# Patient Record
Sex: Male | Born: 1950 | Race: White | Hispanic: No | Marital: Married | Smoking: Never smoker
Health system: Southern US, Community
[De-identification: ages and names within clinical notes are randomized; demographics above are authoritative.]

## PROBLEM LIST (undated history)

## (undated) DIAGNOSIS — I10 Essential (primary) hypertension: Secondary | ICD-10-CM

## (undated) DIAGNOSIS — R112 Nausea with vomiting, unspecified: Secondary | ICD-10-CM

## (undated) DIAGNOSIS — Z9889 Other specified postprocedural states: Secondary | ICD-10-CM

## (undated) DIAGNOSIS — C801 Malignant (primary) neoplasm, unspecified: Secondary | ICD-10-CM

## (undated) DIAGNOSIS — T148XXA Other injury of unspecified body region, initial encounter: Secondary | ICD-10-CM

## (undated) HISTORY — DX: Essential (primary) hypertension: I10

## (undated) HISTORY — PX: KNEE CARTILAGE SURGERY: SHX688

---

## 1999-11-29 ENCOUNTER — Encounter (INDEPENDENT_AMBULATORY_CARE_PROVIDER_SITE_OTHER): Payer: Self-pay

## 1999-11-29 ENCOUNTER — Ambulatory Visit (HOSPITAL_COMMUNITY): Admission: RE | Admit: 1999-11-29 | Discharge: 1999-11-29 | Payer: Self-pay | Admitting: Gastroenterology

## 2002-01-07 ENCOUNTER — Ambulatory Visit (HOSPITAL_COMMUNITY): Admission: RE | Admit: 2002-01-07 | Discharge: 2002-01-07 | Payer: Self-pay | Admitting: Pulmonary Disease

## 2002-02-20 HISTORY — PX: CARDIOVASCULAR STRESS TEST: SHX262

## 2003-02-20 HISTORY — PX: OTHER SURGICAL HISTORY: SHX169

## 2004-06-01 ENCOUNTER — Encounter: Admission: RE | Admit: 2004-06-01 | Discharge: 2004-06-01 | Payer: Self-pay | Admitting: Sports Medicine

## 2004-06-11 ENCOUNTER — Encounter: Admission: RE | Admit: 2004-06-11 | Discharge: 2004-06-11 | Payer: Self-pay | Admitting: Sports Medicine

## 2004-06-22 ENCOUNTER — Ambulatory Visit: Payer: Self-pay | Admitting: Sports Medicine

## 2004-08-03 ENCOUNTER — Ambulatory Visit: Payer: Self-pay | Admitting: Sports Medicine

## 2005-02-15 ENCOUNTER — Ambulatory Visit: Payer: Self-pay | Admitting: Sports Medicine

## 2005-04-19 ENCOUNTER — Ambulatory Visit: Payer: Self-pay | Admitting: Sports Medicine

## 2005-08-31 ENCOUNTER — Encounter: Admission: RE | Admit: 2005-08-31 | Discharge: 2005-08-31 | Payer: Self-pay | Admitting: Sports Medicine

## 2005-08-31 ENCOUNTER — Ambulatory Visit: Payer: Self-pay | Admitting: Sports Medicine

## 2005-09-07 ENCOUNTER — Ambulatory Visit: Payer: Self-pay | Admitting: Sports Medicine

## 2007-05-30 ENCOUNTER — Encounter: Admission: RE | Admit: 2007-05-30 | Discharge: 2007-05-30 | Payer: Self-pay | Admitting: Family Medicine

## 2007-10-11 DIAGNOSIS — T148XXA Other injury of unspecified body region, initial encounter: Secondary | ICD-10-CM

## 2007-10-11 HISTORY — DX: Other injury of unspecified body region, initial encounter: T14.8XXA

## 2009-06-24 ENCOUNTER — Ambulatory Visit: Payer: Self-pay | Admitting: Sports Medicine

## 2009-06-24 DIAGNOSIS — M25569 Pain in unspecified knee: Secondary | ICD-10-CM | POA: Insufficient documentation

## 2009-06-24 DIAGNOSIS — M21619 Bunion of unspecified foot: Secondary | ICD-10-CM | POA: Insufficient documentation

## 2012-03-20 HISTORY — PX: TRANSTHORACIC ECHOCARDIOGRAM: SHX275

## 2012-12-19 ENCOUNTER — Other Ambulatory Visit: Payer: Self-pay | Admitting: Otolaryngology

## 2012-12-19 DIAGNOSIS — J329 Chronic sinusitis, unspecified: Secondary | ICD-10-CM

## 2012-12-21 ENCOUNTER — Ambulatory Visit
Admission: RE | Admit: 2012-12-21 | Discharge: 2012-12-21 | Disposition: A | Discharge status: Home / Self Care | Payer: 59 | Source: Ambulatory Visit | Attending: Otolaryngology | Admitting: Otolaryngology

## 2012-12-21 DIAGNOSIS — J329 Chronic sinusitis, unspecified: Secondary | ICD-10-CM

## 2013-05-07 ENCOUNTER — Encounter (HOSPITAL_COMMUNITY)
Admission: RE | Admit: 2013-05-07 | Discharge: 2013-05-07 | Disposition: A | Discharge status: Home / Self Care | Payer: 59 | Source: Ambulatory Visit | Attending: Ophthalmology | Admitting: Ophthalmology

## 2013-05-07 ENCOUNTER — Encounter (HOSPITAL_COMMUNITY): Payer: Self-pay | Admitting: Pharmacy Technician

## 2013-05-07 ENCOUNTER — Encounter (HOSPITAL_COMMUNITY): Payer: Self-pay

## 2013-05-07 DIAGNOSIS — Z01810 Encounter for preprocedural cardiovascular examination: Secondary | ICD-10-CM | POA: Insufficient documentation

## 2013-05-07 DIAGNOSIS — Z01812 Encounter for preprocedural laboratory examination: Secondary | ICD-10-CM | POA: Insufficient documentation

## 2013-05-07 HISTORY — DX: Other injury of unspecified body region, initial encounter: T14.8XXA

## 2013-05-07 LAB — CBC
HCT: 40.8 % (ref 39.0–52.0)
MCH: 30.9 pg (ref 26.0–34.0)
MCHC: 35 g/dL (ref 30.0–36.0)
MCV: 88.1 fL (ref 78.0–100.0)
Platelets: 255 10*3/uL (ref 150–400)
RBC: 4.63 MIL/uL (ref 4.22–5.81)
RDW: 12.5 % (ref 11.5–15.5)

## 2013-05-07 LAB — BASIC METABOLIC PANEL
BUN: 8 mg/dL (ref 6–23)
Chloride: 102 mEq/L (ref 96–112)
Creatinine, Ser: 1.13 mg/dL (ref 0.50–1.35)
GFR calc Af Amer: 79 mL/min — ABNORMAL LOW (ref >90)
GFR calc non Af Amer: 68 mL/min — ABNORMAL LOW (ref >90)
Potassium: 3.8 mEq/L (ref 3.5–5.1)

## 2013-05-07 NOTE — Patient Instructions (Signed)
Kenneth Booker  05/07/2013   Your procedure is scheduled on:  05/16/2013  Report to [facility] at 10:30 AM.  Call this number if you have problems the morning of surgery: [phone]   Remember:   Do not eat food or drink liquids after midnight.   Take these medicines the morning of surgery with A SIP OF WATER: Irbesartan-HCTZ   Do not wear jewelry, make-up or nail polish.  Do not wear lotions, powders, or perfumes. You may wear deodorant.  Do not shave 48 hours prior to surgery. Men may shave face and neck.  Do not bring valuables to the hospital.  [facility] is not responsible                   for any belongings or valuables.  Contacts, dentures or bridgework may not be worn into surgery.  Leave suitcase in the car. After surgery it may be brought to your room.  For patients admitted to the hospital, checkout time is 11:00 AM the day of  discharge.   Patients discharged the day of surgery will not be allowed to drive  home.  Name and phone number of your driver: [name]  Special Instructions: N/A   Please read over the following fact sheets that you were given: Care and Recovery After Surgery

## 2013-05-15 MED ORDER — NEOMYCIN-POLYMYXIN-DEXAMETH 3.5-10000-0.1 OP OINT
TOPICAL_OINTMENT | OPHTHALMIC | Status: AC
  Filled 2013-05-15: qty 3.5

## 2013-05-15 MED ORDER — LIDOCAINE HCL 3.5 % OP GEL
OPHTHALMIC | Status: AC
  Filled 2013-05-15: qty 5

## 2013-05-15 MED ORDER — LIDOCAINE HCL (PF) 1 % IJ SOLN
INTRAMUSCULAR | Status: AC
  Filled 2013-05-15: qty 2

## 2013-05-15 MED ORDER — CYCLOPENTOLATE-PHENYLEPHRINE 0.2-1 % OP SOLN
OPHTHALMIC | Status: AC
  Filled 2013-05-15: qty 2

## 2013-05-15 MED ORDER — PHENYLEPHRINE HCL 2.5 % OP SOLN
OPHTHALMIC | Status: AC
  Filled 2013-05-15: qty 2

## 2013-05-15 MED ORDER — TETRACAINE HCL 0.5 % OP SOLN
OPHTHALMIC | Status: AC
  Filled 2013-05-15: qty 2

## 2013-05-16 ENCOUNTER — Encounter (HOSPITAL_COMMUNITY)
Admission: RE | Disposition: A | Discharge status: Home / Self Care | Payer: Self-pay | Source: Ambulatory Visit | Attending: Ophthalmology

## 2013-05-16 ENCOUNTER — Ambulatory Visit (HOSPITAL_COMMUNITY): Payer: 59 | Admitting: Anesthesiology

## 2013-05-16 ENCOUNTER — Encounter (HOSPITAL_COMMUNITY): Payer: Self-pay | Admitting: *Deleted

## 2013-05-16 ENCOUNTER — Encounter (HOSPITAL_COMMUNITY): Payer: Self-pay | Admitting: Anesthesiology

## 2013-05-16 ENCOUNTER — Ambulatory Visit (HOSPITAL_COMMUNITY)
Admission: RE | Admit: 2013-05-16 | Discharge: 2013-05-16 | Disposition: A | Discharge status: Home / Self Care | Payer: 59 | Source: Ambulatory Visit | Attending: Ophthalmology | Admitting: Ophthalmology

## 2013-05-16 DIAGNOSIS — H2589 Other age-related cataract: Secondary | ICD-10-CM | POA: Insufficient documentation

## 2013-05-16 DIAGNOSIS — I10 Essential (primary) hypertension: Secondary | ICD-10-CM | POA: Insufficient documentation

## 2013-05-16 HISTORY — DX: Nausea with vomiting, unspecified: R11.2

## 2013-05-16 HISTORY — DX: Nausea with vomiting, unspecified: Z98.890

## 2013-05-16 HISTORY — PX: CATARACT EXTRACTION W/PHACO: SHX586

## 2013-05-16 SURGERY — PHACOEMULSIFICATION, CATARACT, WITH IOL INSERTION
Anesthesia: Monitor Anesthesia Care | Site: Eye | Laterality: Right | Wound class: Clean

## 2013-05-16 MED ORDER — NEOMYCIN-POLYMYXIN-DEXAMETH 0.1 % OP OINT
TOPICAL_OINTMENT | OPHTHALMIC | Status: DC | PRN
  Administered 2013-05-16: 1 via OPHTHALMIC

## 2013-05-16 MED ORDER — LIDOCAINE HCL (PF) 1 % IJ SOLN
INTRAMUSCULAR | Status: DC | PRN
  Administered 2013-05-16: .6 mL

## 2013-05-16 MED ORDER — MIDAZOLAM HCL 2 MG/2ML IJ SOLN
INTRAMUSCULAR | Status: AC
  Filled 2013-05-16: qty 2

## 2013-05-16 MED ORDER — FENTANYL CITRATE 0.05 MG/ML IJ SOLN
INTRAMUSCULAR | Status: DC | PRN
  Administered 2013-05-16 (×2): 25 ug via INTRAVENOUS

## 2013-05-16 MED ORDER — BSS IO SOLN
INTRAOCULAR | Status: DC | PRN
  Administered 2013-05-16: 15 mL via INTRAOCULAR

## 2013-05-16 MED ORDER — PHENYLEPHRINE HCL 2.5 % OP SOLN
1.0000 [drp] | OPHTHALMIC | Status: AC
  Administered 2013-05-16 (×3): 1 [drp] via OPHTHALMIC

## 2013-05-16 MED ORDER — MIDAZOLAM HCL 2 MG/2ML IJ SOLN
1.0000 mg | INTRAMUSCULAR | Status: DC | PRN
  Administered 2013-05-16: 2 mg via INTRAVENOUS

## 2013-05-16 MED ORDER — MIDAZOLAM HCL 5 MG/5ML IJ SOLN
INTRAMUSCULAR | Status: DC | PRN
  Administered 2013-05-16: 2 mg via INTRAVENOUS

## 2013-05-16 MED ORDER — EPINEPHRINE HCL 1 MG/ML IJ SOLN
INTRAOCULAR | Status: DC | PRN
  Administered 2013-05-16: 12:00:00

## 2013-05-16 MED ORDER — TETRACAINE HCL 0.5 % OP SOLN
1.0000 [drp] | OPHTHALMIC | Status: AC
  Administered 2013-05-16 (×3): 1 [drp] via OPHTHALMIC

## 2013-05-16 MED ORDER — LACTATED RINGERS IV SOLN
INTRAVENOUS | Status: DC
  Administered 2013-05-16: 12:00:00 via INTRAVENOUS

## 2013-05-16 MED ORDER — CYCLOPENTOLATE-PHENYLEPHRINE 0.2-1 % OP SOLN
1.0000 [drp] | OPHTHALMIC | Status: AC
  Administered 2013-05-16 (×3): 1 [drp] via OPHTHALMIC

## 2013-05-16 MED ORDER — EPINEPHRINE HCL 1 MG/ML IJ SOLN
INTRAMUSCULAR | Status: AC
  Filled 2013-05-16: qty 1

## 2013-05-16 MED ORDER — ONDANSETRON HCL 4 MG/2ML IJ SOLN: 4.0000 mg | Freq: Once | INTRAMUSCULAR | Status: DC | PRN

## 2013-05-16 MED ORDER — FENTANYL CITRATE 0.05 MG/ML IJ SOLN
INTRAMUSCULAR | Status: AC
  Filled 2013-05-16: qty 2

## 2013-05-16 MED ORDER — FENTANYL CITRATE 0.05 MG/ML IJ SOLN
25.0000 ug | INTRAMUSCULAR | Status: AC
  Administered 2013-05-16 (×2): 25 ug via INTRAVENOUS

## 2013-05-16 MED ORDER — FENTANYL CITRATE 0.05 MG/ML IJ SOLN: 25.0000 ug | INTRAMUSCULAR | Status: DC | PRN

## 2013-05-16 MED ORDER — LIDOCAINE HCL 3.5 % OP GEL
1.0000 "application " | Freq: Once | OPHTHALMIC | Status: AC
  Administered 2013-05-16: 1 via OPHTHALMIC

## 2013-05-16 MED ORDER — POVIDONE-IODINE 5 % OP SOLN
OPHTHALMIC | Status: DC | PRN
  Administered 2013-05-16: 1 via OPHTHALMIC

## 2013-05-16 MED ORDER — PROVISC 10 MG/ML IO SOLN
INTRAOCULAR | Status: DC | PRN
  Administered 2013-05-16: 8.5 mg via INTRAOCULAR

## 2013-05-16 SURGICAL SUPPLY — 32 items
CAPSULAR TENSION RING-AMO (OPHTHALMIC RELATED) IMPLANT
CLOTH BEACON ORANGE TIMEOUT ST (SAFETY) ×2 IMPLANT
EYE SHIELD UNIVERSAL CLEAR (GAUZE/BANDAGES/DRESSINGS) ×2 IMPLANT
GLOVE BIO SURGEON STRL SZ 6.5 (GLOVE) IMPLANT
GLOVE BIOGEL PI IND STRL 6.5 (GLOVE) IMPLANT
GLOVE BIOGEL PI IND STRL 7.0 (GLOVE) IMPLANT
GLOVE BIOGEL PI IND STRL 7.5 (GLOVE) IMPLANT
GLOVE BIOGEL PI INDICATOR 6.5 (GLOVE)
GLOVE BIOGEL PI INDICATOR 7.0 (GLOVE) ×1
GLOVE BIOGEL PI INDICATOR 7.5 (GLOVE)
GLOVE ECLIPSE 6.5 STRL STRAW (GLOVE) IMPLANT
GLOVE ECLIPSE 7.0 STRL STRAW (GLOVE) IMPLANT
GLOVE ECLIPSE 7.5 STRL STRAW (GLOVE) IMPLANT
GLOVE EXAM NITRILE LRG STRL (GLOVE) IMPLANT
GLOVE EXAM NITRILE MD LF STRL (GLOVE) ×1 IMPLANT
GLOVE SKINSENSE NS SZ6.5 (GLOVE)
GLOVE SKINSENSE NS SZ7.0 (GLOVE)
GLOVE SKINSENSE STRL SZ6.5 (GLOVE) IMPLANT
GLOVE SKINSENSE STRL SZ7.0 (GLOVE) IMPLANT
KIT VITRECTOMY (OPHTHALMIC RELATED) IMPLANT
PAD ARMBOARD 7.5X6 YLW CONV (MISCELLANEOUS) ×1 IMPLANT
PROC W NO LENS (INTRAOCULAR LENS)
PROC W SPEC LENS (INTRAOCULAR LENS)
PROCESS W NO LENS (INTRAOCULAR LENS) IMPLANT
PROCESS W SPEC LENS (INTRAOCULAR LENS) IMPLANT
RING MALYGIN (MISCELLANEOUS) IMPLANT
SIGHTPATH CAT PROC W REG LENS (Ophthalmic Related) ×2 IMPLANT
SYR TB 1ML LL NO SAFETY (SYRINGE) ×2 IMPLANT
TAPE SURG TRANSPORE 1 IN (GAUZE/BANDAGES/DRESSINGS) ×1 IMPLANT
TAPE SURGICAL TRANSPORE 1 IN (GAUZE/BANDAGES/DRESSINGS) ×1
VISCOELASTIC ADDITIONAL (OPHTHALMIC RELATED) IMPLANT
WATER STERILE IRR 250ML POUR (IV SOLUTION) ×1 IMPLANT

## 2013-05-16 NOTE — H&P (Signed)
I have reviewed the H&P, the patient was re-examined, and I have identified no interval changes in medical condition and plan of care since the history and physical of record  

## 2013-05-16 NOTE — Transfer of Care (Signed)
Immediate Anesthesia Transfer of Care Note  Patient: Kenneth Booker  Procedure(s) Performed: Procedure(s) with comments: CATARACT EXTRACTION PHACO AND INTRAOCULAR LENS PLACEMENT (IOC) (Right) - CDE:5.25  Patient Location: Short Stay  Anesthesia Type:MAC  Level of Consciousness: awake, alert , oriented and patient cooperative  Airway & Oxygen Therapy: Patient Spontanous Breathing  Post-op Assessment: Report given to PACU RN, Post -op Vital signs reviewed and stable and Patient moving all extremities  Post vital signs: Reviewed and stable  Complications: No apparent anesthesia complications

## 2013-05-16 NOTE — Anesthesia Preprocedure Evaluation (Signed)
Anesthesia Evaluation  Patient identified by MRN, date of birth, ID band Patient awake    Reviewed: Allergy & Precautions, H&P , NPO status , Patient's Chart, lab work & pertinent test results  Airway Mallampati: II TM Distance: >3 FB Neck ROM: Full    Dental  (+) Edentulous Upper and Edentulous Lower   Pulmonary neg pulmonary ROS,  breath sounds clear to auscultation        Cardiovascular hypertension, Pt. on medications Rhythm:Regular Rate:Normal     Neuro/Psych    GI/Hepatic negative GI ROS,   Endo/Other    Renal/GU      Musculoskeletal   Abdominal   Peds  Hematology   Anesthesia Other Findings   Reproductive/Obstetrics                           Anesthesia Physical Anesthesia Plan  ASA: II  Anesthesia Plan: MAC   Post-op Pain Management:    Induction: Intravenous  Airway Management Planned: Nasal Cannula  Additional Equipment:   Intra-op Plan:   Post-operative Plan:   Informed Consent: I have reviewed the patients History and Physical, chart, labs and discussed the procedure including the risks, benefits and alternatives for the proposed anesthesia with the patient or authorized representative who has indicated his/her understanding and acceptance.     Plan Discussed with:   Anesthesia Plan Comments:         Anesthesia Quick Evaluation

## 2013-05-16 NOTE — Anesthesia Postprocedure Evaluation (Signed)
  Anesthesia Post-op Note  Patient: Kenneth Booker  Procedure(s) Performed: Procedure(s) with comments: CATARACT EXTRACTION PHACO AND INTRAOCULAR LENS PLACEMENT (IOC) (Right) - CDE:5.25  Patient Location: Short Stay  Anesthesia Type:MAC  Level of Consciousness: awake, alert , oriented and patient cooperative  Airway and Oxygen Therapy: Patient Spontanous Breathing  Post-op Pain: none  Post-op Assessment: Post-op Vital signs reviewed, Patient's Cardiovascular Status Stable, Respiratory Function Stable, Patent Airway and Pain level controlled  Post-op Vital Signs: Reviewed and stable  Complications: No apparent anesthesia complications

## 2013-05-16 NOTE — Op Note (Signed)
Date of Admission: 05/16/2013  Date of Surgery: 05/16/2013  Pre-Op Dx: Cataract  Right  Eye  Post-Op Dx: Cataract  Right  Eye,  Dx Code 366.19  Surgeon: Gemma Payor, M.D.  Assistants: None  Anesthesia: Topical with MAC  Indications: Painless, progressive loss of vision with compromise of daily activities.  Surgery: Cataract Extraction with Intraocular lens Implant Right Eye  Discription: The patient had dilating drops and viscous lidocaine placed into the left eye in the pre-op holding area. After transfer to the operating room, a time out was performed. The patient was then prepped and draped. Beginning with a 75 degree blade a paracentesis port was made at the surgeon's 2 o'clock position. The anterior chamber was then filled with 1% non-preserved lidocaine. This was followed by filling the anterior chamber with Provisc. A bent cystatome needle was used to create a continuous tear capsulotomy. Hydrodissection was performed with balanced salt solution on a Fine canula. The lens nucleus was then removed using the phacoemulsification handpiece. Residual cortex was removed with the I&A handpiece. The anterior chamber and capsular bag were refilled with Provisc. A posterior chamber intraocular lens was placed into the capsular bag with it's injector. The implant was positioned with the Kuglan hook. The Provisc was then removed from the anterior chamber and capsular bag with the I&A handpiece. Stromal hydration of the main incision and paracentesis port was performed with BSS on a Fine canula. The wounds were tested for leak which was negative. The patient tolerated the procedure well. There were no operative complications. The patient was then transferred to the recovery room in stable condition.  Complications: None  Specimen: None  EBL: None  Prosthetic device: B&L enVista, MX60, power 23.5D, SN [id number].

## 2013-05-17 ENCOUNTER — Encounter (HOSPITAL_COMMUNITY): Payer: Self-pay | Admitting: Ophthalmology

## 2013-05-28 ENCOUNTER — Encounter (HOSPITAL_COMMUNITY): Payer: Self-pay | Admitting: Pharmacy Technician

## 2013-05-30 ENCOUNTER — Encounter (HOSPITAL_COMMUNITY): Payer: Self-pay

## 2013-05-30 ENCOUNTER — Encounter (HOSPITAL_COMMUNITY)
Admission: RE | Admit: 2013-05-30 | Discharge: 2013-05-30 | Disposition: A | Discharge status: Home / Self Care | Payer: 59 | Source: Ambulatory Visit | Attending: Family Medicine | Admitting: Family Medicine

## 2013-05-31 MED ORDER — CYCLOPENTOLATE-PHENYLEPHRINE 0.2-1 % OP SOLN
OPHTHALMIC | Status: AC
  Filled 2013-05-31: qty 2

## 2013-05-31 MED ORDER — TETRACAINE HCL 0.5 % OP SOLN
OPHTHALMIC | Status: AC
  Filled 2013-05-31: qty 2

## 2013-05-31 MED ORDER — LIDOCAINE HCL (PF) 1 % IJ SOLN
INTRAMUSCULAR | Status: AC
  Filled 2013-05-31: qty 2

## 2013-05-31 MED ORDER — LIDOCAINE HCL 3.5 % OP GEL
OPHTHALMIC | Status: AC
  Filled 2013-05-31: qty 5

## 2013-05-31 MED ORDER — NEOMYCIN-POLYMYXIN-DEXAMETH 3.5-10000-0.1 OP OINT
TOPICAL_OINTMENT | OPHTHALMIC | Status: AC
  Filled 2013-05-31: qty 3.5

## 2013-06-03 ENCOUNTER — Encounter (HOSPITAL_COMMUNITY)
Admission: RE | Disposition: A | Discharge status: Home / Self Care | Payer: Self-pay | Source: Ambulatory Visit | Attending: Ophthalmology

## 2013-06-03 ENCOUNTER — Ambulatory Visit (HOSPITAL_COMMUNITY)
Admission: RE | Admit: 2013-06-03 | Discharge: 2013-06-03 | Disposition: A | Discharge status: Home / Self Care | Payer: 59 | Source: Ambulatory Visit | Attending: Ophthalmology | Admitting: Ophthalmology

## 2013-06-03 ENCOUNTER — Encounter (HOSPITAL_COMMUNITY): Payer: Self-pay | Admitting: Anesthesiology

## 2013-06-03 ENCOUNTER — Ambulatory Visit (HOSPITAL_COMMUNITY): Payer: 59 | Admitting: Anesthesiology

## 2013-06-03 ENCOUNTER — Encounter (HOSPITAL_COMMUNITY): Payer: Self-pay | Admitting: *Deleted

## 2013-06-03 DIAGNOSIS — I10 Essential (primary) hypertension: Secondary | ICD-10-CM | POA: Insufficient documentation

## 2013-06-03 DIAGNOSIS — H2589 Other age-related cataract: Secondary | ICD-10-CM | POA: Insufficient documentation

## 2013-06-03 HISTORY — PX: CATARACT EXTRACTION W/PHACO: SHX586

## 2013-06-03 SURGERY — PHACOEMULSIFICATION, CATARACT, WITH IOL INSERTION
Anesthesia: Monitor Anesthesia Care | Site: Eye | Laterality: Left | Wound class: Clean

## 2013-06-03 MED ORDER — FENTANYL CITRATE 0.05 MG/ML IJ SOLN: 25.0000 ug | INTRAMUSCULAR | Status: DC

## 2013-06-03 MED ORDER — ONDANSETRON HCL 4 MG/2ML IJ SOLN
4.0000 mg | Freq: Once | INTRAMUSCULAR | Status: AC
  Administered 2013-06-03: 4 mg via INTRAVENOUS

## 2013-06-03 MED ORDER — PHENYLEPHRINE HCL 2.5 % OP SOLN
1.0000 [drp] | OPHTHALMIC | Status: AC
  Administered 2013-06-03 (×3): 1 [drp] via OPHTHALMIC

## 2013-06-03 MED ORDER — CYCLOPENTOLATE-PHENYLEPHRINE 0.2-1 % OP SOLN
1.0000 [drp] | OPHTHALMIC | Status: AC
  Administered 2013-06-03 (×3): 1 [drp] via OPHTHALMIC

## 2013-06-03 MED ORDER — EPINEPHRINE HCL 1 MG/ML IJ SOLN
INTRAOCULAR | Status: DC | PRN
  Administered 2013-06-03: 12:00:00

## 2013-06-03 MED ORDER — PHENYLEPHRINE HCL 2.5 % OP SOLN
OPHTHALMIC | Status: AC
  Filled 2013-06-03: qty 2

## 2013-06-03 MED ORDER — LIDOCAINE HCL 3.5 % OP GEL: 1.0000 "application " | Freq: Once | OPHTHALMIC | Status: DC

## 2013-06-03 MED ORDER — NEOMYCIN-POLYMYXIN-DEXAMETH 0.1 % OP OINT
TOPICAL_OINTMENT | OPHTHALMIC | Status: DC | PRN
  Administered 2013-06-03: 1

## 2013-06-03 MED ORDER — MIDAZOLAM HCL 2 MG/2ML IJ SOLN
INTRAMUSCULAR | Status: AC
  Filled 2013-06-03: qty 2

## 2013-06-03 MED ORDER — TETRACAINE HCL 0.5 % OP SOLN
1.0000 [drp] | OPHTHALMIC | Status: AC
  Administered 2013-06-03 (×3): 1 [drp] via OPHTHALMIC

## 2013-06-03 MED ORDER — LACTATED RINGERS IV SOLN
INTRAVENOUS | Status: DC
  Administered 2013-06-03: 1000 mL via INTRAVENOUS

## 2013-06-03 MED ORDER — ONDANSETRON HCL 4 MG/2ML IJ SOLN
INTRAMUSCULAR | Status: AC
Start: 2013-06-03 — End: 2013-06-03
  Filled 2013-06-03: qty 2

## 2013-06-03 MED ORDER — POVIDONE-IODINE 5 % OP SOLN
OPHTHALMIC | Status: DC | PRN
  Administered 2013-06-03: 1 via OPHTHALMIC

## 2013-06-03 MED ORDER — MIDAZOLAM HCL 2 MG/2ML IJ SOLN
1.0000 mg | INTRAMUSCULAR | Status: DC | PRN
  Administered 2013-06-03: 2 mg via INTRAVENOUS

## 2013-06-03 MED ORDER — BSS IO SOLN
INTRAOCULAR | Status: DC | PRN
  Administered 2013-06-03: 15 mL via INTRAOCULAR

## 2013-06-03 MED ORDER — PROVISC 10 MG/ML IO SOLN
INTRAOCULAR | Status: DC | PRN
  Administered 2013-06-03: 8.5 mg via INTRAOCULAR

## 2013-06-03 MED ORDER — LIDOCAINE HCL (PF) 1 % IJ SOLN
INTRAMUSCULAR | Status: DC | PRN
  Administered 2013-06-03: .4 mL

## 2013-06-03 SURGICAL SUPPLY — 32 items
CAPSULAR TENSION RING-AMO (OPHTHALMIC RELATED) IMPLANT
CLOTH BEACON ORANGE TIMEOUT ST (SAFETY) ×1 IMPLANT
EYE SHIELD UNIVERSAL CLEAR (GAUZE/BANDAGES/DRESSINGS) ×1 IMPLANT
GLOVE BIO SURGEON STRL SZ 6.5 (GLOVE) IMPLANT
GLOVE BIOGEL PI IND STRL 6.5 (GLOVE) ×1 IMPLANT
GLOVE BIOGEL PI IND STRL 7.0 (GLOVE) ×1 IMPLANT
GLOVE BIOGEL PI IND STRL 7.5 (GLOVE) IMPLANT
GLOVE BIOGEL PI INDICATOR 6.5 (GLOVE) ×1
GLOVE BIOGEL PI INDICATOR 7.0 (GLOVE) ×1
GLOVE BIOGEL PI INDICATOR 7.5 (GLOVE)
GLOVE ECLIPSE 6.5 STRL STRAW (GLOVE) IMPLANT
GLOVE ECLIPSE 7.0 STRL STRAW (GLOVE) IMPLANT
GLOVE ECLIPSE 7.5 STRL STRAW (GLOVE) IMPLANT
GLOVE EXAM NITRILE LRG STRL (GLOVE) IMPLANT
GLOVE EXAM NITRILE MD LF STRL (GLOVE) IMPLANT
GLOVE SKINSENSE NS SZ6.5 (GLOVE)
GLOVE SKINSENSE NS SZ7.0 (GLOVE)
GLOVE SKINSENSE STRL SZ6.5 (GLOVE) IMPLANT
GLOVE SKINSENSE STRL SZ7.0 (GLOVE) IMPLANT
KIT VITRECTOMY (OPHTHALMIC RELATED) IMPLANT
PAD ARMBOARD 7.5X6 YLW CONV (MISCELLANEOUS) ×1 IMPLANT
PROC W NO LENS (INTRAOCULAR LENS)
PROC W SPEC LENS (INTRAOCULAR LENS)
PROCESS W NO LENS (INTRAOCULAR LENS) IMPLANT
PROCESS W SPEC LENS (INTRAOCULAR LENS) IMPLANT
RING MALYGIN (MISCELLANEOUS) IMPLANT
SIGHTPATH CAT PROC W REG LENS (Ophthalmic Related) ×2 IMPLANT
SYR TB 1ML LL NO SAFETY (SYRINGE) ×1 IMPLANT
TAPE SURG TRANSPORE 1 IN (GAUZE/BANDAGES/DRESSINGS) IMPLANT
TAPE SURGICAL TRANSPORE 1 IN (GAUZE/BANDAGES/DRESSINGS) ×1
VISCOELASTIC ADDITIONAL (OPHTHALMIC RELATED) IMPLANT
WATER STERILE IRR 250ML POUR (IV SOLUTION) ×1 IMPLANT

## 2013-06-03 NOTE — Anesthesia Postprocedure Evaluation (Signed)
  Anesthesia Post-op Note  Patient: Kenneth Booker  Procedure(s) Performed: Procedure(s) with comments: CATARACT EXTRACTION PHACO AND INTRAOCULAR LENS PLACEMENT (IOC) (Left) - CDE: 6.26  Patient Location: Short Stay  Anesthesia Type:MAC  Level of Consciousness: awake, alert , oriented and patient cooperative  Airway and Oxygen Therapy: Patient Spontanous Breathing  Post-op Pain: none  Post-op Assessment: Post-op Vital signs reviewed, Patient's Cardiovascular Status Stable, Respiratory Function Stable, Patent Airway, No signs of Nausea or vomiting and Pain level controlled  Post-op Vital Signs: Reviewed and stable  Complications: No apparent anesthesia complications

## 2013-06-03 NOTE — Anesthesia Preprocedure Evaluation (Signed)
Anesthesia Evaluation  Patient identified by MRN, date of birth, ID band Patient awake    Reviewed: Allergy & Precautions, H&P , NPO status , Patient's Chart, lab work & pertinent test results  History of Anesthesia Complications (+) PONV  Airway Mallampati: II TM Distance: >3 FB Neck ROM: Full    Dental  (+) Edentulous Upper and Edentulous Lower   Pulmonary neg pulmonary ROS,  breath sounds clear to auscultation        Cardiovascular hypertension, Pt. on medications Rhythm:Regular Rate:Normal     Neuro/Psych    GI/Hepatic negative GI ROS,   Endo/Other    Renal/GU      Musculoskeletal   Abdominal   Peds  Hematology   Anesthesia Other Findings   Reproductive/Obstetrics                           Anesthesia Physical Anesthesia Plan  ASA: II  Anesthesia Plan: MAC   Post-op Pain Management:    Induction: Intravenous  Airway Management Planned: Nasal Cannula  Additional Equipment:   Intra-op Plan:   Post-operative Plan:   Informed Consent: I have reviewed the patients History and Physical, chart, labs and discussed the procedure including the risks, benefits and alternatives for the proposed anesthesia with the patient or authorized representative who has indicated his/her understanding and acceptance.     Plan Discussed with:   Anesthesia Plan Comments:         Anesthesia Quick Evaluation

## 2013-06-03 NOTE — Op Note (Signed)
Date of Admission: 06/03/2013  Date of Surgery: 06/03/2013  Pre-Op Dx: Cataract  Left  Eye  Post-Op Dx: Cataract  Left  Eye,  Dx Code 366.19  Surgeon: Gemma Payor, M.D.  Assistants: None  Anesthesia: Topical with MAC  Indications: Painless, progressive loss of vision with compromise of daily activities.  Surgery: Cataract Extraction with Intraocular lens Implant Left Eye  Discription: The patient had dilating drops and viscous lidocaine placed into the left eye in the pre-op holding area. After transfer to the operating room, a time out was performed. The patient was then prepped and draped. Beginning with a 75 degree blade a paracentesis port was made at the surgeon's 2 o'clock position. The anterior chamber was then filled with 1% non-preserved lidocaine. This was followed by filling the anterior chamber with Provisc. A bent cystatome needle was used to create a continuous tear capsulotomy. Hydrodissection was performed with balanced salt solution on a Fine canula. The lens nucleus was then removed using the phacoemulsification handpiece. Residual cortex was removed with the I&A handpiece. The anterior chamber and capsular bag were refilled with Provisc. A posterior chamber intraocular lens was placed into the capsular bag with it's injector. The implant was positioned with the Kuglan hook. The Provisc was then removed from the anterior chamber and capsular bag with the I&A handpiece. Stromal hydration of the main incision and paracentesis port was performed with BSS on a Fine canula. The wounds were tested for leak which was negative. The patient tolerated the procedure well. There were no operative complications. The patient was then transferred to the recovery room in stable condition.  Complications: None  Specimen: None  EBL: None  Prosthetic device: B&L enVista, MX60, power 22.5D, SN [id number].

## 2013-06-03 NOTE — Transfer of Care (Signed)
Immediate Anesthesia Transfer of Care Note  Patient: Kenneth Booker  Procedure(s) Performed: Procedure(s) with comments: CATARACT EXTRACTION PHACO AND INTRAOCULAR LENS PLACEMENT (IOC) (Left) - CDE: 6.26  Patient Location: Short Stay  Anesthesia Type:MAC  Level of Consciousness: awake, alert , oriented and patient cooperative  Airway & Oxygen Therapy: Patient Spontanous Breathing  Post-op Assessment: Report given to PACU RN, Post -op Vital signs reviewed and stable and Patient moving all extremities  Post vital signs: Reviewed and stable  Complications: No apparent anesthesia complications

## 2013-06-03 NOTE — H&P (Signed)
I have reviewed the H&P, the patient was re-examined, and I have identified no interval changes in medical condition and plan of care since the history and physical of record  

## 2013-06-03 NOTE — Preoperative (Signed)
Beta Blockers   Reason not to administer Beta Blockers:Not Applicable 

## 2013-06-05 ENCOUNTER — Encounter (HOSPITAL_COMMUNITY): Payer: Self-pay | Admitting: Ophthalmology

## 2013-06-11 ENCOUNTER — Emergency Department (HOSPITAL_COMMUNITY)
Admission: EM | Admit: 2013-06-11 | Discharge: 2013-06-11 | Disposition: A | Discharge status: Home / Self Care | Payer: 59 | Attending: Emergency Medicine | Admitting: Emergency Medicine

## 2013-06-11 ENCOUNTER — Encounter (HOSPITAL_COMMUNITY): Payer: Self-pay | Admitting: Emergency Medicine

## 2013-06-11 DIAGNOSIS — I10 Essential (primary) hypertension: Secondary | ICD-10-CM | POA: Insufficient documentation

## 2013-06-11 DIAGNOSIS — Z7982 Long term (current) use of aspirin: Secondary | ICD-10-CM | POA: Insufficient documentation

## 2013-06-11 DIAGNOSIS — R5381 Other malaise: Secondary | ICD-10-CM | POA: Insufficient documentation

## 2013-06-11 DIAGNOSIS — R002 Palpitations: Secondary | ICD-10-CM | POA: Insufficient documentation

## 2013-06-11 DIAGNOSIS — Z79899 Other long term (current) drug therapy: Secondary | ICD-10-CM | POA: Insufficient documentation

## 2013-06-11 DIAGNOSIS — Z87828 Personal history of other (healed) physical injury and trauma: Secondary | ICD-10-CM | POA: Insufficient documentation

## 2013-06-11 LAB — CBC WITH DIFFERENTIAL/PLATELET
Basophils Absolute: 0 10*3/uL (ref 0.0–0.1)
Basophils Relative: 1 % (ref 0–1)
Lymphocytes Relative: 29 % (ref 12–46)
MCHC: 35.9 g/dL (ref 30.0–36.0)
Monocytes Absolute: 0.5 10*3/uL (ref 0.1–1.0)
Neutro Abs: 2.6 10*3/uL (ref 1.7–7.7)
Neutrophils Relative %: 56 % (ref 43–77)
Platelets: 263 10*3/uL (ref 150–400)
RDW: 12.6 % (ref 11.5–15.5)
WBC: 4.7 10*3/uL (ref 4.0–10.5)

## 2013-06-11 LAB — COMPREHENSIVE METABOLIC PANEL
ALT: 13 U/L (ref 0–53)
AST: 18 U/L (ref 0–37)
Albumin: 4.2 g/dL (ref 3.5–5.2)
Chloride: 108 mEq/L (ref 96–112)
Creatinine, Ser: 0.96 mg/dL (ref 0.50–1.35)
Potassium: 4.2 mEq/L (ref 3.5–5.1)
Sodium: 145 mEq/L (ref 135–145)
Total Bilirubin: 0.4 mg/dL (ref 0.3–1.2)

## 2013-06-11 LAB — POCT I-STAT TROPONIN I

## 2013-06-11 NOTE — ED Provider Notes (Signed)
CSN: [id number]     Arrival date & time 06/11/13  1824 History   First MD Initiated Contact with Patient 06/11/13 2038     Chief Complaint  Patient presents with  . Palpitations   (Consider location/radiation/quality/duration/timing/severity/associated sxs/prior Treatment) Patient is a 62 y.o. male presenting with palpitations. The history is provided by the patient. No language interpreter was used.  Palpitations Palpitations quality:  Irregular Onset quality:  Gradual Duration:  7 days Timing:  Intermittent Progression:  Waxing and waning Chronicity:  New Context: not anxiety, not caffeine, not illicit drugs, not nicotine and not stimulant use   Relieved by:  None tried Worsened by:  Nothing tried Ineffective treatments:  None tried Associated symptoms: weakness   Associated symptoms: no shortness of breath and no syncope     Past Medical History  Diagnosis Date  . Hypertension   . Cartilage tear 2009    right knee  . PONV (postoperative nausea and vomiting)    Past Surgical History  Procedure Laterality Date  . Knee cartilage surgery Right 2007?  Marland Kitchen Cataract extraction w/phaco Right 05/16/2013    Procedure: CATARACT EXTRACTION PHACO AND INTRAOCULAR LENS PLACEMENT (IOC);  Surgeon: Gemma Payor, MD;  Location: [facility];  Service: Ophthalmology;  Laterality: Right;  CDE:5.25  . Cataract extraction w/phaco Left 06/03/2013    Procedure: CATARACT EXTRACTION PHACO AND INTRAOCULAR LENS PLACEMENT (IOC);  Surgeon: Gemma Payor, MD;  Location: [facility];  Service: Ophthalmology;  Laterality: Left;  CDE: 6.26   No family history on file. History  Substance Use Topics  . Smoking status: Never Smoker   . Smokeless tobacco: Not on file  . Alcohol Use: No    Review of Systems  Respiratory: Negative for shortness of breath.   Cardiovascular: Positive for palpitations. Negative for syncope.  All other systems reviewed and are negative.    Allergies  Norvasc  Home Medications   Current  Outpatient Rx  Name  Route  Sig  Dispense  Refill  . aspirin 81 MG tablet   Oral   Take 81 mg by mouth daily.         Marland Kitchen Besifloxacin HCl (BESIVANCE) 0.6 % SUSP   Left Eye   Place 1 drop into the left eye 3 (three) times daily.         . Bromfenac Sodium (PROLENSA) 0.07 % SOLN   Left Eye   Place 1 drop into the left eye daily.         . Difluprednate (DUREZOL) 0.05 % EMUL   Left Eye   Place 1 drop into the left eye 3 (three) times daily.         . irbesartan-hydrochlorothiazide (AVALIDE) 150-12.5 MG per tablet   Oral   Take 1 tablet by mouth daily.          BP 135/83  Pulse 50  Temp(Src) 98.1 F (36.7 C) (Oral)  Resp 18  SpO2 95% Physical Exam  Nursing note and vitals reviewed. Constitutional: He is oriented to person, place, and time. He appears well-developed and well-nourished.  HENT:  Head: Normocephalic.  Eyes: Pupils are equal, round, and reactive to light.  Neck: Normal range of motion.  Cardiovascular: Normal rate and regular rhythm.   Pulmonary/Chest: Effort normal and breath sounds normal.  Abdominal: Soft. Bowel sounds are normal.  Musculoskeletal: Normal range of motion. He exhibits no edema and no tenderness.  Lymphadenopathy:    He has no cervical adenopathy.  Neurological: He is alert and oriented to  person, place, and time.  Skin: Skin is warm and dry.  Psychiatric: He has a normal mood and affect. His behavior is normal. Judgment and thought content normal.    ED Course  Procedures (including critical care time) Labs Review Labs Reviewed  COMPREHENSIVE METABOLIC PANEL - Abnormal; Notable for the following:    GFR calc non Af Amer 88 (*)    All other components within normal limits  CBC WITH DIFFERENTIAL  POCT I-STAT TROPONIN I   Imaging Review No results found.  Date: 06/11/2013  Rate: 67   Rhythm: normal sinus rhythm  QRS Axis: normal  Intervals: normal  ST/T Wave abnormalities: normal  Conduction Disutrbances:none   Narrative Interpretation: NSR  Old EKG Reviewed: unchanged Palpitations seem to be related with recent completion of cataract surgery.  Patient states intermittent palpitations over the last week, worsening today.  Check of current medications does not point to palpitations as a side effect.  Continuous monitoring in ED does not reveal any ectopic beats.  Labs reviewed, WNL. Results shared with patient.  Patient to follow-up with PCP. MDM  Palpitations.    Jimmye Norman, NP 06/12/13 [phone]

## 2013-06-11 NOTE — ED Notes (Signed)
Pt. reports palpitations " heart skipping " for several days , denies chest pain , no SOB or nausea, pt. stated history of cataract surgery 3 weeks ago.

## 2013-06-11 NOTE — ED Notes (Signed)
Patient resting. Denies palpitations. Sinus Bradycardia on monitor.

## 2013-06-14 NOTE — ED Provider Notes (Signed)
Medical screening examination/treatment/procedure(s) were performed by non-physician practitioner and as supervising physician I was immediately available for consultation/collaboration.  Chrisanne Loose T Mary-Anne Polizzi, MD 06/14/13 0755 

## 2013-06-18 ENCOUNTER — Ambulatory Visit (INDEPENDENT_AMBULATORY_CARE_PROVIDER_SITE_OTHER): Payer: 59 | Admitting: Cardiovascular Disease

## 2013-06-18 ENCOUNTER — Encounter: Payer: Self-pay | Admitting: Cardiovascular Disease

## 2013-06-18 VITALS — BP 118/86 | HR 68 | Resp 16 | Ht 66.0 in | Wt 176.8 lb

## 2013-06-18 DIAGNOSIS — R002 Palpitations: Secondary | ICD-10-CM

## 2013-06-18 DIAGNOSIS — I059 Rheumatic mitral valve disease, unspecified: Secondary | ICD-10-CM

## 2013-06-18 DIAGNOSIS — I10 Essential (primary) hypertension: Secondary | ICD-10-CM

## 2013-06-18 DIAGNOSIS — I341 Nonrheumatic mitral (valve) prolapse: Secondary | ICD-10-CM

## 2013-06-18 NOTE — Patient Instructions (Addendum)
We will call with the results of your 14 day Event Monitor.  Your physician recommends that you schedule a follow-up appointment in: 3 months.

## 2013-06-19 DIAGNOSIS — R002 Palpitations: Secondary | ICD-10-CM | POA: Insufficient documentation

## 2013-06-26 DIAGNOSIS — I341 Nonrheumatic mitral (valve) prolapse: Secondary | ICD-10-CM | POA: Insufficient documentation

## 2013-06-26 DIAGNOSIS — I10 Essential (primary) hypertension: Secondary | ICD-10-CM | POA: Insufficient documentation

## 2013-06-26 NOTE — Assessment & Plan Note (Signed)
His most recent echocardiogram showed only borderline criteria for mitral valve prolapse and there was only trivial mitral insufficiency. His physical exam does not in any way suggested this has worsened. I doubt that it has anything to do with his current complaint

## 2013-06-26 NOTE — Assessment & Plan Note (Signed)
Blood pressure is well controlled. Hydrochlorothiazide related hypokalemia was considered, but his lab tests were normal at his ER visit.

## 2013-06-26 NOTE — Assessment & Plan Note (Signed)
It sounds most likely that Kenneth Booker is having isolated PACs and or PVCs. It is hard for me to find a connection between this and his eyedrops. One of the eyedrops as a quinolone antibiotic and theoretically could cause QT interval prolongation and arrhythmia, but I think this is a bit of a stretch. Have recommended that he wear an event monitor and we will base further evaluation and treatment on the findings of this.

## 2013-06-26 NOTE — Progress Notes (Signed)
Patient ID: Kenneth Booker, male   DOB: November 30, 1950, 62 y.o.   MRN: [id number]     Reason for office visit Palpitations  Kenneth Booker is a fit 62 year old with a history of hypertension and borderline criteria for mitral valve prolapse who has recently developed palpitations lasting for about a week. He associates this with recent cataract surgery and thinks it might be related to one of the eyedrops that he is using. These eyedrops seemed to be a steroid, and nonsteroidal anti-inflammatory and a quinolone antibiotic.  He does not have shortness of breath or chest pain and denies full blown syncope but the palpitations were so frequent and troublesome that he actually went to the emergency room. While there he did not have any ectopy on the monitor. Laboratory tests were normal. His electrocardiogram was normal. He continued to have palpitations after leaving the emergency room and has decided to suspend all his eyedrops.  Following his cataract surgery he has not been working out at a gym but up to that point he had been going lifting weights at least once a week. He did not have any palpitations during exercise    Allergies  Allergen Reactions  . Norvasc [Amlodipine Besylate] Anxiety    Current Outpatient Prescriptions  Medication Sig Dispense Refill  . aspirin 81 MG tablet Take 81 mg by mouth daily.      . irbesartan-hydrochlorothiazide (AVALIDE) 150-12.5 MG per tablet Take 1 tablet by mouth daily.      Marland Kitchen Besifloxacin HCl (BESIVANCE) 0.6 % SUSP Place 1 drop into the left eye 3 (three) times daily.      . Bromfenac Sodium (PROLENSA) 0.07 % SOLN Place 1 drop into the left eye daily.      . Difluprednate (DUREZOL) 0.05 % EMUL Place 1 drop into the left eye 3 (three) times daily.       No current facility-administered medications for this visit.    Past Medical History  Diagnosis Date  . Hypertension   . Cartilage tear 2009    right knee  . PONV (postoperative nausea and vomiting)      Past Surgical History  Procedure Laterality Date  . Knee cartilage surgery Right 2007?  Marland Kitchen Cataract extraction w/phaco Right 05/16/2013    Procedure: CATARACT EXTRACTION PHACO AND INTRAOCULAR LENS PLACEMENT (IOC);  Surgeon: Gemma Payor, MD;  Location: [facility];  Service: Ophthalmology;  Laterality: Right;  CDE:5.25  . Cataract extraction w/phaco Left 06/03/2013    Procedure: CATARACT EXTRACTION PHACO AND INTRAOCULAR LENS PLACEMENT (IOC);  Surgeon: Gemma Payor, MD;  Location: [facility];  Service: Ophthalmology;  Laterality: Left;  CDE: 6.26    No family history on file.  History   Social History  . Marital Status: Married    Spouse Name: N/A    Number of Children: N/A  . Years of Education: N/A   Occupational History  . Not on file.   Social History Main Topics  . Smoking status: Never Smoker   . Smokeless tobacco: Not on file  . Alcohol Use: No  . Drug Use: No  . Sexual Activity: Not on file   Other Topics Concern  . Not on file   Social History Narrative  . No narrative on file    Review of systems: The patient specifically denies any chest pain at rest or with exertion, dyspnea at rest or with exertion, orthopnea, paroxysmal nocturnal dyspnea, syncope, palpitations, focal neurological deficits, intermittent claudication, lower extremity edema, unexplained weight gain, cough, hemoptysis or wheezing.  The patient also denies abdominal pain, nausea, vomiting, dysphagia, diarrhea, constipation, polyuria, polydipsia, dysuria, hematuria, frequency, urgency, abnormal bleeding or bruising, fever, chills, unexpected weight changes, mood swings, change in skin or hair texture, change in voice quality, auditory or visual problems, allergic reactions or rashes, new musculoskeletal complaints other than usual "aches and pains".   PHYSICAL EXAM BP 118/86  Pulse 68  Resp 16  Ht 5\' 6"  (1.676 m)  Wt 176 lb 12.8 oz (80.196 kg)  BMI 28.55 kg/m2  General: Alert, oriented x3, no  distress Head: no evidence of trauma, PERRL, EOMI, no exophtalmos or lid lag, no myxedema, no xanthelasma; normal ears, nose and oropharynx Neck: normal jugular venous pulsations and no hepatojugular reflux; brisk carotid pulses without delay and no carotid bruits Chest: clear to auscultation, no signs of consolidation by percussion or palpation, normal fremitus, symmetrical and full respiratory excursions Cardiovascular: normal position and quality of the apical impulse, regular rhythm, normal first and second heart sounds, no murmurs, rubs or gallops Abdomen: no tenderness or distention, no masses by palpation, no abnormal pulsatility or arterial bruits, normal bowel sounds, no hepatosplenomegaly Extremities: no clubbing, cyanosis or edema; 2+ radial, ulnar and brachial pulses bilaterally; 2+ right femoral, posterior tibial and dorsalis pedis pulses; 2+ left femoral, posterior tibial and dorsalis pedis pulses; no subclavian or femoral bruits Neurological: grossly nonfocal   EKG: NSR  Lipid Panel  No results found for this basename: chol, trig, hdl, cholhdl, vldl, ldlcalc    BMET    Component Value Date/Time   NA 145 06/11/2013 1845   K 4.2 06/11/2013 1845   CL 108 06/11/2013 1845   CO2 28 06/11/2013 1845   GLUCOSE 94 06/11/2013 1845   BUN 13 06/11/2013 1845   CREATININE 0.96 06/11/2013 1845   CALCIUM 9.4 06/11/2013 1845   GFRNONAA 88* 06/11/2013 1845   GFRAA >90 06/11/2013 1845     ASSESSMENT AND PLAN Heart palpitations It sounds most likely that Kenneth Booker is having isolated PACs and or PVCs. It is hard for me to find a connection between this and his eyedrops. One of the eyedrops as a quinolone antibiotic and theoretically could cause QT interval prolongation and arrhythmia, but I think this is a bit of a stretch. Have recommended that he wear an event monitor and we will base further evaluation and treatment on the findings of this.  MVP (mitral valve prolapse) His most recent echocardiogram showed  only borderline criteria for mitral valve prolapse and there was only trivial mitral insufficiency. His physical exam does not in any way suggested this has worsened. I doubt that it has anything to do with his current complaint  HTN (hypertension)  Blood pressure is well controlled. Hydrochlorothiazide related hypokalemia was considered, but his lab tests were normal at his ER visit.  Orders Placed This Encounter  Procedures  . Cardiac event monitor   No orders of the defined types were placed in this encounter.    Junious Silk, MD, [facility] [phone] office [phone] pager

## 2013-07-04 ENCOUNTER — Telehealth: Payer: Self-pay | Admitting: *Deleted

## 2013-07-04 NOTE — Telephone Encounter (Signed)
Event monitor results called to patient.  Occas. PAC's w/atrial couplets/short runs of PAT, nocturnal bradycardia.  Patient states his sx have completely resolved since stopping the eye drops and the medicine has gotten out of his system. Instructed to call if he has any problems.  Voiced understanding.

## 2013-07-18 ENCOUNTER — Encounter: Payer: Self-pay | Admitting: Cardiovascular Disease

## 2013-09-25 ENCOUNTER — Ambulatory Visit: Payer: 59 | Admitting: Cardiovascular Disease

## 2013-11-14 ENCOUNTER — Ambulatory Visit: Payer: 59 | Admitting: Cardiovascular Disease

## 2013-11-21 ENCOUNTER — Ambulatory Visit: Payer: 59 | Admitting: Cardiovascular Disease

## 2013-12-20 ENCOUNTER — Ambulatory Visit (INDEPENDENT_AMBULATORY_CARE_PROVIDER_SITE_OTHER): Payer: 59 | Admitting: Cardiovascular Disease

## 2013-12-20 ENCOUNTER — Encounter: Payer: Self-pay | Admitting: Cardiovascular Disease

## 2013-12-20 VITALS — BP 142/90 | HR 56 | Resp 16 | Ht 66.0 in | Wt 174.1 lb

## 2013-12-20 DIAGNOSIS — I10 Essential (primary) hypertension: Secondary | ICD-10-CM

## 2013-12-20 DIAGNOSIS — R002 Palpitations: Secondary | ICD-10-CM

## 2013-12-20 NOTE — Progress Notes (Signed)
Patient ID: Kenneth Booker, male   DOB: [birth year], 63 y.o.   MRN: [id number]      Reason for office visit Followup palpitations, hypertension, mitral valve prolapse  Kenneth Booker's palpitations resolved after he stopped using steroid eyedrops. He is back to his usual pattern of a rare skipped beats. He walks 4 miles a day in roughly one hour, 4 or 5 days a week. He chops his own firewood. After retiring he has stopped going to the gym since it is too far from his home. He has noticed some worsening complaints of arthritis. He denies any cardiac symptoms. 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 He has some degree of white coat hypertension.  His event monitor showed PACs and occasional atrial couplets that correlated well with his palpitations. There was no complex atrial or ventricular arrhythmia. His baseline rhythm is mild sinus bradycardia.   Allergies  Allergen Reactions  . Tarka [Trandolapril-Verapamil Hcl Er]   . Norvasc [Amlodipine Besylate] Anxiety    Current Outpatient Prescriptions  Medication Sig Dispense Refill  . aspirin 81 MG tablet Take 81 mg by mouth daily.      . irbesartan-hydrochlorothiazide (AVALIDE) 150-12.5 MG per tablet Take 1 tablet by mouth daily.       No current facility-administered medications for this visit.    Past Medical History  Diagnosis Date  . Cartilage tear 2009    right knee  . PONV (postoperative nausea and vomiting)   . Systemic hypertension     Past Surgical History  Procedure Laterality Date  . Knee cartilage surgery Right 2007?  [facility] Cataract extraction w/phaco Right 05/16/2013    Procedure: CATARACT EXTRACTION PHACO AND INTRAOCULAR LENS PLACEMENT (IOC);  Surgeon: Tonny Branch, MD;  Location: [facility];  Service: Ophthalmology;  Laterality: Right;  CDE:5.25  . Cataract extraction w/phaco Left 06/03/2013    Procedure: CATARACT EXTRACTION PHACO AND INTRAOCULAR LENS  PLACEMENT (IOC);  Surgeon: Tonny Branch, MD;  Location: [facility];  Service: Ophthalmology;  Laterality: Left;  CDE: 6.26  . Renal duplex  02/20/2003    Bilateral renal arteries demonstrate normal valveswith no hemodynamically significance. Bilateral kidneysare symmetrical in shape with no obvious abnormality visualized.  . Cardiovascular stress test  02/20/2002    Negative adequate Bruce protocal exercise stress test, there is scintigraphic evidence of apical thinning with normal dynamic images EF 60% calculated by QGS  . Transthoracic echocardiogram  03/20/2012    EF >55%, suggestive of borderline impaired LV relaxation with normal tissue doppler, mild MR, mild TR.     Family History  Problem Relation Age of Onset  . Heart attack Mother   . Cancer Father     Lung cancer  . Hypertension Sister   . Cancer Sister 86    Breast cancer  . Cancer Brother     Colon cancer  . Diabetes Paternal Grandmother   . Hypertension Sister   . Cancer Brother     Stomach cancer    History   Social History  . Marital Status: Married    Spouse Name: N/A    Number of Children: N/A  . Years of Education: N/A   Occupational History  . Not on file.   Social History Main Topics  . Smoking status: Never Smoker   . Smokeless tobacco: Not on file  . Alcohol Use: No  . Drug Use: No  . Sexual Activity: Not on  file   Other Topics Concern  . Not on file   Social History Narrative  . No narrative on file    Review of systems: The patient specifically denies any chest pain at rest or with exertion, dyspnea at rest or with exertion, orthopnea, paroxysmal nocturnal dyspnea, syncope, palpitations, focal neurological deficits, intermittent claudication, lower extremity edema, unexplained weight gain, cough, hemoptysis or wheezing.  The patient also denies abdominal pain, nausea, vomiting, dysphagia, diarrhea, constipation, polyuria, polydipsia, dysuria, hematuria, frequency, urgency, abnormal bleeding or  bruising, fever, chills, unexpected weight changes, mood swings, change in skin or hair texture, change in voice quality, auditory or visual problems, allergic reactions or rashes, new musculoskeletal complaints other than usual "aches and pains".   PHYSICAL EXAM BP 142/90  Pulse 56  Resp 16  Ht 5\' 6"  (1.676 m)  Wt 78.971 kg (174 lb 1.6 oz)  BMI 28.11 kg/m2  General: Alert, oriented x3, no distress Head: no evidence of trauma, PERRL, EOMI, no exophtalmos or lid lag, no myxedema, no xanthelasma; normal ears, nose and oropharynx Neck: normal jugular venous pulsations and no hepatojugular reflux; brisk carotid pulses without delay and no carotid bruits Chest: clear to auscultation, no signs of consolidation by percussion or palpation, normal fremitus, symmetrical and full respiratory excursions Cardiovascular: normal position and quality of the apical impulse, regular rhythm, normal first and second heart sounds, no murmurs, rubs or gallops Abdomen: no tenderness or distention, no masses by palpation, no abnormal pulsatility or arterial bruits, normal bowel sounds, no hepatosplenomegaly Extremities: no clubbing, cyanosis or edema; 2+ radial, ulnar and brachial pulses bilaterally; 2+ right femoral, posterior tibial and dorsalis pedis pulses; 2+ left femoral, posterior tibial and dorsalis pedis pulses; no subclavian or femoral bruits Neurological: grossly nonfocal   EKG: nsr  Lipid Panel  No results found for this basename: chol, trig, hdl, cholhdl, vldl, ldlcalc    BMET    Component Value Date/Time   NA 145 06/11/2013 1845   K 4.2 06/11/2013 1845   CL 108 06/11/2013 1845   CO2 28 06/11/2013 1845   GLUCOSE 94 06/11/2013 1845   BUN 13 06/11/2013 1845   CREATININE 0.96 06/11/2013 1845   CALCIUM 9.4 06/11/2013 1845   GFRNONAA 88* 06/11/2013 1845   GFRAA >90 06/11/2013 1845     ASSESSMENT AND PLAN  I congratulated Kenneth Booker for continued attention to his health and exercising regularly. He may benefit  from restarting some type of upper body exercise in addition to his walking as this may help with his arthritis. His arrhythmia is a 9 and minimally symptomatic and it does not require specific therapy. I believe that his blood pressure is indeed well controlled and that he has some mild "white coat hypertension". There is no evidence that his mitral valve prolapse is a clinically important issue. He will followup on a yearly basis.  Orders Placed This Encounter  Procedures  . EKG 12-Lead   No orders of the defined types were placed in this encounter.    Holli Humbles, MD, [place] [phone] office [phone] pager

## 2013-12-20 NOTE — Patient Instructions (Signed)
Dr.Croitoru wants you to follow-up in: 1 year. You will receive a reminder letter in the mail two months in advance. If you don't receive a letter, please call our office to schedule the follow-up appointment.  

## 2014-05-07 ENCOUNTER — Encounter: Payer: Self-pay | Admitting: Cardiovascular Disease

## 2014-05-07 ENCOUNTER — Telehealth: Payer: Self-pay | Admitting: Cardiovascular Disease

## 2014-05-07 MED ORDER — AVALIDE 150-12.5 MG PO TABS: 1.0000 | ORAL_TABLET | Freq: Every day | ORAL | Status: DC

## 2014-05-07 NOTE — Telephone Encounter (Signed)
Kenneth Booker is calling because his blood pressure medication he seems to have side effects of anxiety Irbesartan 150/12.5 mg .. Please call    Thanks

## 2014-05-07 NOTE — Telephone Encounter (Signed)
This encounter was created in error - please disregard.

## 2014-05-07 NOTE — Telephone Encounter (Signed)
Spoke with pt, he has been on avalide for quite sometime but it was recently changed to a generic. The pt has started having anxiety problems as he did before with norvasc. He did not take the avalide sunday and had no problem with anxiety. He took it Monday and the anxiety came back. He would like to get the brand name only. Will send a script to the pharm and wait for contact information to call his pharm. He was not able to give me that information today. Patient voiced understanding the process to get it approved may take time.

## 2014-05-07 NOTE — Telephone Encounter (Signed)
Spoke to the nurse this morning about his medication and wants to know if we can send in a prescription for his Bp medication Ablaline(not sure of the spelling )  to the his pharmacy [facility] in  [place]..   Thanks

## 2014-08-20 ENCOUNTER — Encounter: Payer: Self-pay | Admitting: Cardiovascular Disease

## 2014-08-25 ENCOUNTER — Encounter: Payer: Self-pay | Admitting: Cardiovascular Disease

## 2014-12-31 ENCOUNTER — Telehealth: Payer: Self-pay | Admitting: Cardiovascular Disease

## 2014-12-31 DIAGNOSIS — Z79899 Other long term (current) drug therapy: Secondary | ICD-10-CM

## 2014-12-31 MED ORDER — IRBESARTAN-HYDROCHLOROTHIAZIDE 300-12.5 MG PO TABS: 1.0000 | ORAL_TABLET | Freq: Every day | ORAL | Status: DC

## 2014-12-31 NOTE — Telephone Encounter (Signed)
Pt reports hadn't checked BP in ~1+ month, checked the other day and noted higher than normal.  Takes avalide daily in AM as only med. Takes BP reading in afternoon.  Pt having no symptoms w/ this - wanted to know if med adjustments were necessary. I updated med list OTP.  I advised would defer to Dr. Sallyanne Kuster for advice. Also added pt for routine 1 yr f/u in May as he was last seen March 2015.   Will f/u w/ patient w/ recommendation.

## 2014-12-31 NOTE — Telephone Encounter (Signed)
Pt advised to keep check on BPs, start new med. Instructed to report to [facility] at end of April for lab draw. He voiced understanding.  Refill submitted to patient's preferred pharmacy.

## 2014-12-31 NOTE — Telephone Encounter (Signed)
Please increase to Avalide 300/12.5, check BMET in a month and see me first available (non-urgent)

## 2014-12-31 NOTE — Telephone Encounter (Signed)
Mr.Gough is calling because he is waiting for the outcome of what was decided by Dr. Sallyanne Kuster

## 2014-12-31 NOTE — Telephone Encounter (Signed)
Pt c/o BP issue: STAT if pt c/o blurred vision, one-sided weakness or slurred speech  1. What are your last 5 BP readings? 3/21 5:30- 137/83, 8pm-133/82, 3/22 7am- 143/95, 7:30am 138/99, 147/96 4PM, 139/98 7PM, 3/23 145/93  2. Are you having any other symptoms (ex. Dizziness, headache, blurred vision, passed out)? NO  3. What is your BP issue? It seems to be higher than normal

## 2015-02-19 ENCOUNTER — Ambulatory Visit (INDEPENDENT_AMBULATORY_CARE_PROVIDER_SITE_OTHER): Payer: 59 | Admitting: Cardiovascular Disease

## 2015-02-19 ENCOUNTER — Encounter: Payer: Self-pay | Admitting: Cardiovascular Disease

## 2015-02-19 VITALS — BP 140/86 | HR 64 | Resp 16 | Ht 66.0 in | Wt 175.3 lb

## 2015-02-19 DIAGNOSIS — Z8249 Family history of ischemic heart disease and other diseases of the circulatory system: Secondary | ICD-10-CM

## 2015-02-19 DIAGNOSIS — I341 Nonrheumatic mitral (valve) prolapse: Secondary | ICD-10-CM

## 2015-02-19 DIAGNOSIS — I10 Essential (primary) hypertension: Secondary | ICD-10-CM

## 2015-02-19 NOTE — Progress Notes (Signed)
Patient ID: Kenneth Booker, male   DOB: December 03, 1950, 64 y.o.   MRN: [id number]     Cardiology Office Note   Date:  02/20/2015   ID:  Kenneth Booker, DOB Feb 02, 1951, MRN [id number]  PCP:  Simona Huh, MD  Cardiologist:   Sanda Klein, MD   Chief Complaint  Patient presents with  . Annual Exam    No complaints of chest pain, SOB, edema or dizziness.  BP was recently elevated (146/90's) for several days but went back down on its own.      History of Present Illness: Kenneth Booker is a 64 y.o. male who presents for follow-up for mitral valve prolapse with mild mitral insufficiency, systemic hypertension and a family history of premature CAD. He has no cardiac complaints whatsoever and is still very active. He has had to slow down on running because of knee problems. He is considering going back to riding a bicycle. He remains very fit and still chops his own firewood and exercises, but has gained some weight. He is now moderately overweight with a BMI of 28. He still wears 32 inch pants and these are fairly loose. He is quite athletic. He denies any chest pain, shortness of breath, edema, dizziness. He is no longer bothered by palpitations. His blood pressure is borderline high.  Past Medical History  Diagnosis Date  . Cartilage tear 2009    right knee  . PONV (postoperative nausea and vomiting)   . Systemic hypertension     Past Surgical History  Procedure Laterality Date  . Knee cartilage surgery Right 2007?  [facility] Cataract extraction w/phaco Right 05/16/2013    Procedure: CATARACT EXTRACTION PHACO AND INTRAOCULAR LENS PLACEMENT (IOC);  Surgeon: Tonny Branch, MD;  Location: [facility];  Service: Ophthalmology;  Laterality: Right;  CDE:5.25  . Cataract extraction w/phaco Left 06/03/2013    Procedure: CATARACT EXTRACTION PHACO AND INTRAOCULAR LENS PLACEMENT (IOC);  Surgeon: Tonny Branch, MD;  Location: [facility];  Service: Ophthalmology;  Laterality: Left;  CDE: 6.26  . Renal duplex   02/20/2003    Bilateral renal arteries demonstrate normal valveswith no hemodynamically significance. Bilateral kidneysare symmetrical in shape with no obvious abnormality visualized.  . Cardiovascular stress test  02/20/2002    Negative adequate Bruce protocal exercise stress test, there is scintigraphic evidence of apical thinning with normal dynamic images EF 60% calculated by QGS  . Transthoracic echocardiogram  03/20/2012    EF >55%, suggestive of borderline impaired LV relaxation with normal tissue doppler, mild MR, mild TR.      Current Outpatient Prescriptions  Medication Sig Dispense Refill  . AVALIDE 150-12.5 MG per tablet Take 1 tablet by mouth daily.  9   No current facility-administered medications for this visit.    Allergies:   Tarka and Norvasc    Social History:  The patient  reports that he has never smoked. He does not have any smokeless tobacco history on file. He reports that he does not drink alcohol or use illicit drugs.   Family History:  The patient's family history includes Cancer in his brother, brother, and father; Cancer (age of onset: 40) in his sister; Diabetes in his paternal grandmother; Heart attack in his mother; Hypertension in his sister and sister.    ROS:  Please see the history of present illness.    Otherwise, review of systems positive for none.   All other systems are reviewed and negative.    PHYSICAL EXAM: VS:  BP 140/86 mmHg  Pulse  64  Ht 5\' 6"  (1.676 m)  Wt 175 lb 4.8 oz (79.516 kg)  BMI 28.31 kg/m2 , BMI Body mass index is 28.31 kg/(m^2).  General: Alert, oriented x3, no distress Head: no evidence of trauma, PERRL, EOMI, no exophtalmos or lid lag, no myxedema, no xanthelasma; normal ears, nose and oropharynx Neck: normal jugular venous pulsations and no hepatojugular reflux; brisk carotid pulses without delay and no carotid bruits Chest: clear to auscultation, no signs of consolidation by percussion or palpation, normal fremitus,  symmetrical and full respiratory excursions Cardiovascular: normal position and quality of the apical impulse, regular rhythm, normal first and second heart sounds, no murmurs, rubs or gallops. He has a distinct systolic click that intensifies following the Valsalva maneuver but I cannot hear murmur even following provocative maneuvers Abdomen: no tenderness or distention, no masses by palpation, no abnormal pulsatility or arterial bruits, normal bowel sounds, no hepatosplenomegaly Extremities: no clubbing, cyanosis or edema; 2+ radial, ulnar and brachial pulses bilaterally; 2+ right femoral, posterior tibial and dorsalis pedis pulses; 2+ left femoral, posterior tibial and dorsalis pedis pulses; no subclavian or femoral bruits Neurological: grossly nonfocal Psych: euthymic mood, full affect   EKG:  EKG is ordered today. The ekg ordered today demonstrates normal sinus rhythm 64 bpm   Recent Labs: No results found for requested labs within last 365 days.    Lipid Panel No results found for: CHOL, TRIG, HDL, CHOLHDL, VLDL, LDLCALC, LDLDIRECT    Wt Readings from Last 3 Encounters:  02/19/15 175 lb 4.8 oz (79.516 kg)  12/20/13 174 lb 1.6 oz (78.971 kg)  06/18/13 176 lb 12.8 oz (80.196 kg)      ASSESSMENT AND PLAN:  [place]'s blood pressure is borderline high, but he has always had a component of "white coat hypertension. His recent weight gain and lower level of physical activity may be playing a role. I encouraged him to go ahead with his plans to write his bicycle. He has physical findings and echo images consistent with mitral valve prolapse, without significant mitral regurgitation. No specific intervention is necessary. We'll see him on a yearly basis.   Current medicines are reviewed at length with the patient today.  The patient does not have concerns regarding medicines.  The following changes have been made:  no change  Labs/ tests ordered today include:  Orders Placed This  Encounter  Procedures  . EKG 12-Lead   Patient Instructions  Dr. Sallyanne Kuster recommends that you schedule a follow-up appointment in: One Year      Signed, Makaleigh Reinard, MD  02/20/2015 5:12 PM    Sanda Klein, MD, [facility] [phone] office [phone] pager

## 2015-02-19 NOTE — Patient Instructions (Signed)
Dr. Croitoru recommends that you schedule a follow-up appointment in: One Year.   

## 2015-02-20 ENCOUNTER — Encounter: Payer: Self-pay | Admitting: Cardiovascular Disease

## 2015-02-20 DIAGNOSIS — Z8249 Family history of ischemic heart disease and other diseases of the circulatory system: Secondary | ICD-10-CM | POA: Insufficient documentation

## 2015-02-25 ENCOUNTER — Encounter: Payer: Self-pay | Admitting: Cardiovascular Disease

## 2015-11-19 ENCOUNTER — Ambulatory Visit (INDEPENDENT_AMBULATORY_CARE_PROVIDER_SITE_OTHER): Payer: 59 | Admitting: Cardiovascular Disease

## 2015-11-19 ENCOUNTER — Encounter: Payer: Self-pay | Admitting: Cardiovascular Disease

## 2015-11-19 VITALS — BP 134/78 | HR 69 | Ht 66.0 in | Wt 180.2 lb

## 2015-11-19 DIAGNOSIS — I341 Nonrheumatic mitral (valve) prolapse: Secondary | ICD-10-CM | POA: Diagnosis not present

## 2015-11-19 DIAGNOSIS — I10 Essential (primary) hypertension: Secondary | ICD-10-CM

## 2015-11-19 NOTE — Patient Instructions (Signed)
Dr. Croitoru recommends that you schedule a follow-up appointment in: ONE YEAR   

## 2015-11-19 NOTE — Progress Notes (Signed)
Patient ID: Kenneth Booker, male   DOB: Jan 12, 1951, 65 y.o.   MRN: [id number]    Cardiology Office Note    Date:  11/19/2015   ID:  Kenneth Booker, DOB 04/10/1951, MRN [id number]  PCP:  Simona Huh, MD  Cardiologist:   Sanda Klein, MD   Chief Complaint  Patient presents with  . Follow-up    Home BP's running higher since December 147/94 to 138/88.  He has eaten barbeque several times recently and noticed his BP was elevated after eating.. No complaints of chest pain, SOB, edema or dizziness.    History of Present Illness:  Kenneth Booker is a 65 y.o. male with hypertension and mild mitral valve prolapse, previous problems with palpitations. 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 He has gained weight. He has been less attentive to his diet. His BMI is now greater than 29 but he is still wearing 32 inch pants. Reports labs performed with his primary care provider recently that showed total cholesterol 150 and glucose 94, but does not know the details of his lipid profile. He has noticed that episodically his blood pressure may be elevated, but detailed recordings that he has kept over the last 30 days showed that the vast majority of the time his blood pressure is fully within desirable range. He has also noticed that eating salty foods such as barbecue will raise his blood pressure, while riding his bicycle will make it lower. He denies any cardiovascular complaints.    Past Medical History  Diagnosis Date  . Cartilage tear 2009    right knee  . PONV (postoperative nausea and vomiting)   . Systemic hypertension     Past Surgical History  Procedure Laterality Date  . Knee cartilage surgery Right 2007?  [facility] Cataract extraction w/phaco Right 05/16/2013    Procedure: CATARACT EXTRACTION PHACO AND INTRAOCULAR LENS PLACEMENT (IOC);  Surgeon: Tonny Branch, MD;  Location: [facility];  Service:  Ophthalmology;  Laterality: Right;  CDE:5.25  . Cataract extraction w/phaco Left 06/03/2013    Procedure: CATARACT EXTRACTION PHACO AND INTRAOCULAR LENS PLACEMENT (IOC);  Surgeon: Tonny Branch, MD;  Location: [facility];  Service: Ophthalmology;  Laterality: Left;  CDE: 6.26  . Renal duplex  02/20/2003    Bilateral renal arteries demonstrate normal valveswith no hemodynamically significance. Bilateral kidneysare symmetrical in shape with no obvious abnormality visualized.  . Cardiovascular stress test  02/20/2002    Negative adequate Bruce protocal exercise stress test, there is scintigraphic evidence of apical thinning with normal dynamic images EF 60% calculated by QGS  . Transthoracic echocardiogram  03/20/2012    EF >55%, suggestive of borderline impaired LV relaxation with normal tissue doppler, mild MR, mild TR.     Outpatient Prescriptions Prior to Visit  Medication Sig Dispense Refill  . AVALIDE 150-12.5 MG per tablet Take 1 tablet by mouth daily.  9   No facility-administered medications prior to visit.     Allergies:   Tarka and Norvasc   Social History   Social History  . Marital Status: Married    Spouse Name: N/A  . Number of Children: N/A  . Years of Education: N/A   Social History Main Topics  . Smoking status: Never Smoker   . Smokeless tobacco: None  . Alcohol Use: No  . Drug Use: No  . Sexual Activity: Not Asked   Other Topics Concern  . None  Social History Narrative     Family History:  The patient's family history includes Cancer in his brother, brother, and father; Cancer (age of onset: 30) in his sister; Diabetes in his paternal grandmother; Heart attack in his mother; Hypertension in his sister and sister.   ROS:   Please see the history of present illness.    ROS All other systems reviewed and are negative.   PHYSICAL EXAM:   VS:  BP 134/78 mmHg  Pulse 69  Ht 5\' 6"  (1.676 m)  Wt 81.738 kg (180 lb 3.2 oz)  BMI 29.10 kg/m2   GEN: Well nourished,  well developed, in no acute distress HEENT: normal Neck: no JVD, carotid bruits, or masses Cardiac: RRR; no murmurs, rubs, or gallops,no edema . There is a late systolic click that becomes more obvious with the Valsalva maneuver but no murmur is heard at rest or with provocative maneuvers Respiratory:  clear to auscultation bilaterally, normal work of breathing GI: soft, nontender, nondistended, + BS MS: no deformity or atrophy Skin: warm and dry, no rash Neuro:  Alert and Oriented x 3, Strength and sensation are intact Psych: euthymic mood, full affect  Wt Readings from Last 3 Encounters:  11/19/15 81.738 kg (180 lb 3.2 oz)  02/19/15 79.516 kg (175 lb 4.8 oz)  12/20/13 78.971 kg (174 lb 1.6 oz)      Studies/Labs Reviewed:   EKG:  EKG is ordered today.  The ekg ordered today demonstrates sinus rhythm, normal tracing. Deep but very sharp Q waves in the inferior leads.    ASSESSMENT:    1. Essential hypertension   2. MVP (mitral valve prolapse)      PLAN:  In order of problems listed above:  1. Overall, his blood pressure control is excellent. I don't think medication changes were necessary. Encouraged him to try to lose the weight that he has gained didn't remain physically active 2. He has a distinct systolic click from mitral valve prolapse but previous evaluation with echo showed only mild mitral insufficiency. He had palpitations that are currently not bothering him. I don't think there is any need for reevaluation this time.    Medication Adjustments/Labs and Tests Ordered: Current medicines are reviewed at length with the patient today.  Concerns regarding medicines are outlined above.  Medication changes, Labs and Tests ordered today are listed in the Patient Instructions below. Patient Instructions  Dr. Sallyanne Kuster recommends that you schedule a follow-up appointment in: [place] Klein, MD  11/19/2015 9:58 AM    [address], [place], [place]  [address] Phone: [phone]; Fax: [phone]

## 2016-09-13 ENCOUNTER — Telehealth: Payer: Self-pay | Admitting: Cardiovascular Disease

## 2016-09-13 DIAGNOSIS — M1712 Unilateral primary osteoarthritis, left knee: Secondary | ICD-10-CM | POA: Diagnosis not present

## 2016-09-13 NOTE — Telephone Encounter (Signed)
Patient states he was seen today at [facility] and needs to have surgery.  They should be sending a surgical clearance for Dr. Sallyanne Kuster.

## 2016-09-13 NOTE — Telephone Encounter (Signed)
Spoke with patient and he was told to call the office   Request for surgical clearance:  1. What type of surgery is being performed? Total Knee Replacement   2. When is this surgery scheduled? TBD   3. Are there any medications that need to be held prior to surgery and how long?    4. Name of physician performing surgery? Dr Verlee Monte   5. What is your office phone and fax number? Phone [phone]  Will forward to [facility] CMA and Dr Sallyanne Kuster for review

## 2016-09-14 ENCOUNTER — Encounter: Payer: Self-pay | Admitting: Cardiovascular Disease

## 2016-09-14 NOTE — Telephone Encounter (Signed)
Sent via epic 

## 2016-09-27 NOTE — Telephone Encounter (Signed)
Faxed clearance letter to Kelly/Sherri's attention at [facility] on 09/20/16.

## 2016-11-11 ENCOUNTER — Ambulatory Visit (INDEPENDENT_AMBULATORY_CARE_PROVIDER_SITE_OTHER): Payer: Medicare Other | Admitting: Cardiovascular Disease

## 2016-11-11 ENCOUNTER — Encounter: Payer: Self-pay | Admitting: Cardiovascular Disease

## 2016-11-11 ENCOUNTER — Other Ambulatory Visit: Payer: Self-pay | Admitting: Family Medicine

## 2016-11-11 VITALS — BP 136/90 | HR 55 | Ht 66.5 in | Wt 173.8 lb

## 2016-11-11 DIAGNOSIS — Z136 Encounter for screening for cardiovascular disorders: Secondary | ICD-10-CM | POA: Diagnosis not present

## 2016-11-11 DIAGNOSIS — Z23 Encounter for immunization: Secondary | ICD-10-CM | POA: Diagnosis not present

## 2016-11-11 DIAGNOSIS — Z125 Encounter for screening for malignant neoplasm of prostate: Secondary | ICD-10-CM | POA: Diagnosis not present

## 2016-11-11 DIAGNOSIS — R002 Palpitations: Secondary | ICD-10-CM

## 2016-11-11 DIAGNOSIS — I10 Essential (primary) hypertension: Secondary | ICD-10-CM

## 2016-11-11 DIAGNOSIS — Z13220 Encounter for screening for lipoid disorders: Secondary | ICD-10-CM | POA: Diagnosis not present

## 2016-11-11 DIAGNOSIS — I341 Nonrheumatic mitral (valve) prolapse: Secondary | ICD-10-CM | POA: Diagnosis not present

## 2016-11-11 DIAGNOSIS — Z0000 Encounter for general adult medical examination without abnormal findings: Secondary | ICD-10-CM | POA: Diagnosis not present

## 2016-11-11 NOTE — Progress Notes (Signed)
Patient ID: Kenneth Booker, male   DOB: 1950/12/15, 66 y.o.   MRN: [id number]    Cardiology Office Note    Date:  11/11/2016   ID:  Kenneth Booker, DOB 06/11/1951, MRN [id number]  PCP:  Simona Huh, MD  Cardiologist:   Sanda Klein, MD   Chief Complaint  Patient presents with  . Palpitations    patient complians of palpitations that he feels was percipitated by genric avalide.    History of Present Illness:  Kenneth Booker is a 66 y.o. male with hypertension and mild mitral valve prolapse and palpitations.   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 He has managed to lose most of the weight he gained after retirement. He feels generally well, except for palpitations as described below.  The patient specifically denies any chest pain at rest exertion, dyspnea at rest or with exertion, orthopnea, paroxysmal nocturnal dyspnea, syncope, focal neurological deficits, intermittent claudication, lower extremity edema, unexplained weight gain, cough, hemoptysis or wheezing.   Due to insurance he switched to generic irbesartan- HCTZ. He also took am OTC supplement and tea for BP and weight loss. He had recurrent palpitations. He started brand name Avalide (5 tabs cost $50). He also stopped the supplements and his palpitations resolved.    He has a physical (and labs) planned with Dr. Marisue Humble later today.   Past Medical History:  Diagnosis Date  . Cartilage tear 2009   right knee  . PONV (postoperative nausea and vomiting)   . Systemic hypertension     Past Surgical History:  Procedure Laterality Date  . CARDIOVASCULAR STRESS TEST  02/20/2002   Negative adequate Bruce protocal exercise stress test, there is scintigraphic evidence of apical thinning with normal dynamic images EF 60% calculated by QGS  . CATARACT EXTRACTION W/PHACO Right 05/16/2013   Procedure: CATARACT EXTRACTION PHACO AND INTRAOCULAR LENS PLACEMENT (IOC);  Surgeon: Tonny Branch, MD;  Location:  [facility];  Service: Ophthalmology;  Laterality: Right;  CDE:5.25  . CATARACT EXTRACTION W/PHACO Left 06/03/2013   Procedure: CATARACT EXTRACTION PHACO AND INTRAOCULAR LENS PLACEMENT (IOC);  Surgeon: Tonny Branch, MD;  Location: [facility];  Service: Ophthalmology;  Laterality: Left;  CDE: 6.26  . KNEE CARTILAGE SURGERY Right 2007?  . RENAL DUPLEX  02/20/2003   Bilateral renal arteries demonstrate normal valveswith no hemodynamically significance. Bilateral kidneysare symmetrical in shape with no obvious abnormality visualized.  . TRANSTHORACIC ECHOCARDIOGRAM  03/20/2012   EF >55%, suggestive of borderline impaired LV relaxation with normal tissue doppler, mild MR, mild TR.     Outpatient Medications Prior to Visit  Medication Sig Dispense Refill  . AVALIDE 150-12.5 MG per tablet Take 1 tablet by mouth daily.  9   No facility-administered medications prior to visit.      Allergies:   Tarka [trandolapril-verapamil hcl er] and Norvasc [amlodipine besylate]   Social History   Social History  . Marital status: Married    Spouse name: N/A  . Number of children: N/A  . Years of education: N/A   Social History Main Topics  . Smoking status: Never Smoker  . Smokeless tobacco: Not on file  . Alcohol use No  . Drug use: No  . Sexual activity: Not on file   Other Topics Concern  . Not on file   Social History Narrative  . No narrative on file     Family History:  The patient's family history includes Cancer in his brother, brother, and father; Cancer (  age of onset: 60) in his sister; Diabetes in his paternal grandmother; Heart attack in his mother; Hypertension in his sister and sister.   ROS:   Please see the history of present illness.    ROS All other systems reviewed and are negative.   PHYSICAL EXAM:   VS:  BP 136/90   Pulse (!) 55   Ht 5' 6.5" (1.689 m)   Wt 78.8 kg (173 lb 12.8 oz)   BMI 27.63 kg/m    GEN: Well nourished, well developed, in no acute distress  HEENT: normal    Neck: no JVD, carotid bruits, or masses Cardiac: RRR; no murmurs, rubs, or gallops,no edema . There is a late systolic click that becomes more obvious with the Valsalva maneuver but no murmur is heard at rest or with provocative maneuvers Respiratory:  clear to auscultation bilaterally, normal work of breathing GI: soft, nontender, nondistended, + BS MS: no deformity or atrophy  Skin: warm and dry, no rash Neuro:  Alert and Oriented x 3, Strength and sensation are intact Psych: euthymic mood, full affect  Wt Readings from Last 3 Encounters:  11/11/16 78.8 kg (173 lb 12.8 oz)  11/19/15 81.7 kg (180 lb 3.2 oz)  02/19/15 79.5 kg (175 lb 4.8 oz)      Studies/Labs Reviewed:   EKG:  EKG is ordered today.  The ekg ordered today demonstrates mild sinus bradycardia, normal tracing. QTc 407 ms.    ASSESSMENT:    1. Heart palpitations   2. MVP (mitral valve prolapse)   3. Essential hypertension      PLAN:  In order of problems listed above:  1. Palpitations: may have been due to ingredients in the supplements or low K levels from different bioavailability of HCTZ in the generic drug or diuretic effects of the supplements. Try switching back to irbesartan-HCTZ without the supplements. If symptoms return, consider switching to chlorthalidone or amlodipine instead of HCTZ. Labs will be checked later today. 2. HTN: Overall, his blood pressure control is excellent. I don't think medication changes were necessary. Encouraged him to try to lose the weight that he has gained didn't remain physically active 3. MVP: He has a distinct systolic click from mitral valve prolapse but previous evaluation with echo showed only mild mitral insufficiency. He had palpitations that are currently not bothering him. I don't think there is any need for reevaluation this time.    Medication Adjustments/Labs and Tests Ordered: Current medicines are reviewed at length with the patient today.  Concerns regarding  medicines are outlined above.  Medication changes, Labs and Tests ordered today are listed in the Patient Instructions below. Patient Instructions  Dr Sallyanne Kuster recommends that you schedule a follow-up appointment in 1 year. You will receive a reminder letter in the mail two months in advance. If you don't receive a letter, please call our office to schedule the follow-up appointment.  If you need a refill on your cardiac medications before your next appointment, please call your pharmacy.      Signed, Sanda Klein, MD  11/11/2016 6:27 PM    [facility] [place], [place], [place]  [address] Phone: [phone]; Fax: [phone]

## 2016-11-11 NOTE — Patient Instructions (Signed)
Dr Croitoru recommends that you schedule a follow-up appointment in 1 year. You will receive a reminder letter in the mail two months in advance. If you don't receive a letter, please call our office to schedule the follow-up appointment.  If you need a refill on your cardiac medications before your next appointment, please call your pharmacy. 

## 2016-11-18 ENCOUNTER — Ambulatory Visit
Admission: RE | Admit: 2016-11-18 | Discharge: 2016-11-18 | Disposition: A | Discharge status: Home / Self Care | Payer: Medicare Other | Source: Ambulatory Visit | Attending: Family Medicine | Admitting: Family Medicine

## 2016-11-18 DIAGNOSIS — Z136 Encounter for screening for cardiovascular disorders: Secondary | ICD-10-CM

## 2016-11-18 DIAGNOSIS — Z87891 Personal history of nicotine dependence: Secondary | ICD-10-CM | POA: Diagnosis not present

## 2016-11-29 ENCOUNTER — Telehealth: Payer: Self-pay | Admitting: Cardiovascular Disease

## 2016-11-29 MED ORDER — IRBESARTAN 150 MG PO TABS: 150.0000 mg | ORAL_TABLET | Freq: Every day | ORAL | 5 refills | Status: DC

## 2016-11-29 MED ORDER — AMLODIPINE BESYLATE 5 MG PO TABS: 5.0000 mg | ORAL_TABLET | Freq: Every day | ORAL | 3 refills | Status: DC

## 2016-11-29 NOTE — Telephone Encounter (Signed)
From notes: 11/11/16  Palpitations: may have been due to ingredients in the supplements or low K levels from different bioavailability of HCTZ in the generic drug or diuretic effects of the supplements. Try switching back to irbesartan-HCTZ without the supplements. If symptoms return, consider switching to chlorthalidone or amlodipine instead of HCTZ. Labs will be checked later today.

## 2016-11-29 NOTE — Telephone Encounter (Signed)
F/U CALL:  Patient is calling back in regards to a possible switch in medication. Please call, thanks.

## 2016-11-29 NOTE — Telephone Encounter (Signed)
Did we ever get the lab results? (I believe done at [facility]?) If I am reading the msg correctly, palpitations are still bothering him. If that is the case:  Switch to irbesartan 150 mg daily and amlodipine 5 mg daily. Send BP readings in 2 weeks please.  Thanks [employer]

## 2016-11-29 NOTE — Telephone Encounter (Signed)
New Message     Pt c/o medication issue:  1. Name of Medication:  AVALIDE 150-12.5 MG per tablet Take 1 tablet by mouth daily     2. How are you currently taking this medication (dosage and times per day)? 1 tablet daily   3. Are you having a reaction (difficulty breathing--STAT)? None   4. What is your medication issue? Giving him heart palpitations   Spoke with Dr C a few days ago and was told to call if this continued to happen, Pt states Dr C may want to switch medication

## 2016-11-29 NOTE — Telephone Encounter (Signed)
Recommendations relayed to patient, who voiced understanding & thanks. At his request, Rx sent to local pharmacy. He's aware to keep [place] updated and inform if any concerns w new med regimen.

## 2016-12-01 ENCOUNTER — Telehealth: Payer: Self-pay | Admitting: Cardiovascular Disease

## 2016-12-01 NOTE — Telephone Encounter (Signed)
Attempted to contact on phone # - line busy Attempted to contact on cell # - line disconnected

## 2016-12-01 NOTE — Telephone Encounter (Signed)
New Message     Why is Dr C putting him back on Norvasc, he has a reaction to this medication ? Please call

## 2016-12-01 NOTE — Telephone Encounter (Signed)
IF BP remains in that range, we do not need to add any more meds MCr

## 2016-12-01 NOTE — Telephone Encounter (Signed)
Per allergy list:  Norvasc [Amlodipine Besylate]  Anxiety Low Hypersensitivity   Returned call to patient. He states amlodipine made his heart pound and put him out of work for 6 weeks. Originally Rx'ed by PCP. He states Avalide caused palpitations - was on this previously  Explained to patient that anxiety is an intolerance to the medication, not a true allergy such as swelling, rash, etc  Patient states BP at home is running 110s-120s/70s-80s  He states he noticed that reducing caffeine intake has helped BP - explained it will help with palpitations. He states he has not caffeine in weeks.   Informed him will have to send message to MD  Can contact on cell if cannot reach at home #: [phone] (c)

## 2016-12-02 NOTE — Telephone Encounter (Signed)
Returned call to patient. Informed him that MD said if he has BPs in 110s-120s/70s-80s then he does NOT need additional BP medication (amlodipine). Advised patient to only take irbesartan, monitor BP at home no more than twice daily, 1-2 hours after taking BP med and after resting 5-20minutes in a sitting position. Advised if his BP stays in the range listed, continue irbesartan. If BP is trending higher after a few days of monitoring, to call office with readings. patient voiced understanding.

## 2016-12-02 NOTE — Telephone Encounter (Signed)
Follow up    Pt is calling to follow up about medication he was prescribed that he is allergic to.

## 2016-12-06 ENCOUNTER — Telehealth: Payer: Self-pay | Admitting: Cardiovascular Disease

## 2016-12-06 MED ORDER — IRBESARTAN 300 MG PO TABS: 300.0000 mg | ORAL_TABLET | Freq: Every day | ORAL | 5 refills | Status: DC

## 2016-12-06 NOTE — Telephone Encounter (Signed)
Pt voiced understanding of recommendations, will track and follow. Indicated he may try a pill and a half of the 150mg  for total of 225mg  and track BPs for a few days before moving to 300mg . Advised OK and if he has concerns to call. Pt voiced appreciation of response.

## 2016-12-06 NOTE — Telephone Encounter (Signed)
Spoke to patient. Notes he had palps which were thought to be coming from HCTZ,  was taken off irbesartan-HCTZ recently, in favor of irbesartan and amlodipine separately for his BP control.  He didn't realize amlodipine was norvasc, notes he had bad reaction to this med in the past (severe anxiety).  Currently BPs running 135-140 over 80-90. Patient asking if he can just up dose of irbesartan to correct further.  Informed patient I would seek advice - advised the BP readings he gave aren't very elevated, would see what Dr. Sallyanne Kuster advises for correction.

## 2016-12-06 NOTE — Telephone Encounter (Signed)
New Message  Pt voiced he would like for nurse to give him a call in regard to adjusting medications if it did not work.

## 2016-12-06 NOTE — Telephone Encounter (Signed)
Irbesartan 300 mg daily please

## 2016-12-30 ENCOUNTER — Ambulatory Visit (INDEPENDENT_AMBULATORY_CARE_PROVIDER_SITE_OTHER): Payer: Medicare Other | Admitting: Cardiovascular Disease

## 2016-12-30 ENCOUNTER — Encounter: Payer: Self-pay | Admitting: Cardiovascular Disease

## 2016-12-30 VITALS — BP 142/88 | HR 73 | Ht 66.0 in | Wt 170.0 lb

## 2016-12-30 DIAGNOSIS — I341 Nonrheumatic mitral (valve) prolapse: Secondary | ICD-10-CM

## 2016-12-30 DIAGNOSIS — I10 Essential (primary) hypertension: Secondary | ICD-10-CM | POA: Diagnosis not present

## 2016-12-30 DIAGNOSIS — F411 Generalized anxiety disorder: Secondary | ICD-10-CM

## 2016-12-30 DIAGNOSIS — R002 Palpitations: Secondary | ICD-10-CM | POA: Diagnosis not present

## 2016-12-30 NOTE — Progress Notes (Signed)
SUBJECTIVE: The patient is a 66 year old male with hypertension and mild mitral valve prolapse as well as palpitations. He was most recently evaluated by Dr. Sallyanne Kuster on 11/11/16.  He gave me an extensive history of palpitations which began in his late 6s. He has seen several cardiologists over the last couple of decades. He has had several adjustments to blood pressure medications. He appears to be intolerant of calcium channel blockers.   He recently cut his antihypertensive medication down to 150/12.5 mg daily of Avalide and his palpitations have subsided. He showed me several blood pressure readings with most of them within the normal range.   Labs 11/11/16 showed creatinine 0.93, potassium 4.4, total cholesterol 154, HDL 48, LDL 93, triglycerides 65.  He told me about a time he used steroid eyedrops which increased the frequency of palpitations. He has previously worn an event monitor.  He tells me he has road rage and can be very anxious at times. He had been on Paxil for 6 months several years ago and this made him feel better. He has not spoken to his primary care physician about his anxiety.   He denies chest pain and shortness of breath as well as leg swelling.   He exercises by walking, lifting weights, and bike riding.   Review of Systems: As per "subjective", otherwise negative.  Allergies  Allergen Reactions  . Tarka [Trandolapril-Verapamil Hcl Er]   . Norvasc [Amlodipine Besylate] Anxiety    Current Outpatient Prescriptions  Medication Sig Dispense Refill  . AVALIDE 150-12.5 MG tablet TAKE 1 TABLET BY MOUTH ONCE A DAY FOR 90 DAYS  1   No current facility-administered medications for this visit.     Past Medical History:  Diagnosis Date  . Cartilage tear 2009   right knee  . PONV (postoperative nausea and vomiting)   . Systemic hypertension     Past Surgical History:  Procedure Laterality Date  . CARDIOVASCULAR STRESS TEST  02/20/2002   Negative adequate  Bruce protocal exercise stress test, there is scintigraphic evidence of apical thinning with normal dynamic images EF 60% calculated by QGS  . CATARACT EXTRACTION W/PHACO Right 05/16/2013   Procedure: CATARACT EXTRACTION PHACO AND INTRAOCULAR LENS PLACEMENT (IOC);  Surgeon: Tonny Branch, MD;  Location: [facility];  Service: Ophthalmology;  Laterality: Right;  CDE:5.25  . CATARACT EXTRACTION W/PHACO Left 06/03/2013   Procedure: CATARACT EXTRACTION PHACO AND INTRAOCULAR LENS PLACEMENT (IOC);  Surgeon: Tonny Branch, MD;  Location: [facility];  Service: Ophthalmology;  Laterality: Left;  CDE: 6.26  . KNEE CARTILAGE SURGERY Right 2007?  . RENAL DUPLEX  02/20/2003   Bilateral renal arteries demonstrate normal valveswith no hemodynamically significance. Bilateral kidneysare symmetrical in shape with no obvious abnormality visualized.  . TRANSTHORACIC ECHOCARDIOGRAM  03/20/2012   EF >55%, suggestive of borderline impaired LV relaxation with normal tissue doppler, mild MR, mild TR.     Social History   Social History  . Marital status: Married    Spouse name: N/A  . Number of children: N/A  . Years of education: N/A   Occupational History  . Not on file.   Social History Main Topics  . Smoking status: Never Smoker  . Smokeless tobacco: Never Used  . Alcohol use No  . Drug use: No  . Sexual activity: Not on file   Other Topics Concern  . Not on file   Social History Narrative  . No narrative on file     Vitals:   12/30/16  1517  BP: (!) 142/88  Pulse: 73  SpO2: 98%  Weight: 170 lb (77.1 kg)  Height: 5\' 6"  (1.676 m)    PHYSICAL EXAM General: NAD HEENT: Normal. Neck: No JVD, no thyromegaly. Lungs: Clear to auscultation bilaterally with normal respiratory effort. CV: Nondisplaced PMI.  Regular rate and rhythm, normal S1/S2, no S3/S4, no murmur. No pretibial or periankle edema.  No carotid bruit.   Abdomen: Soft, nontender, no distention.  Neurologic: Alert and oriented.  Psych: Normal  affect. Skin: Normal. Musculoskeletal: No gross deformities.    ECG: Most recent ECG reviewed.      ASSESSMENT AND PLAN: 1. Palpitations:Symptomatically stable on decreased dose of Avalide. His palpitations and blood pressure are likely exacerbated by a generalized anxiety disorder. I strongly encouraged him to speak to his primary care physician about this as he likely warrants treatment. He does not tolerate calcium channel blockers.  2. Hypertension: Mildly elevated today but it appears it is often elevated at healthcare providers offices. I reviewed his readings from home and they are primarily normal. No changes to therapy. His palpitations and blood pressure are likely exacerbated by a generalized anxiety disorder. I strongly encouraged him to speak to his primary care physician about this as he likely warrants treatment.  3. Mitral valve prolapse: Mild. No need for reevaluation at this time. No murmurs appreciated.  4. Generalized anxiety disorder: His palpitations and blood pressure are likely exacerbated by a generalized anxiety disorder. I strongly encouraged him to speak to his primary care physician about this as he likely warrants treatment. He had been on Paxil several years ago which apparently helped his symptoms.  Dispo: fu 1 year.  Time spent: 40 minutes, of which greater than 50% was spent reviewing symptoms, relevant blood tests and studies, and discussing management plan with the patient.    Kate Sable, M.D., F.A.C.C.

## 2016-12-30 NOTE — Patient Instructions (Signed)
Your physician wants you to follow-up in: 1 Year with Dr. Koneswaran.  You will receive a reminder letter in the mail two months in advance. If you don't receive a letter, please call our office to schedule the follow-up appointment.  Your physician recommends that you continue on your current medications as directed. Please refer to the Current Medication list given to you today.  If you need a refill on your cardiac medications before your next appointment, please call your pharmacy.  Thank you for choosing [facility]!   

## 2017-06-26 DIAGNOSIS — M5030 Other cervical disc degeneration, unspecified cervical region: Secondary | ICD-10-CM | POA: Diagnosis not present

## 2017-07-16 DIAGNOSIS — Z23 Encounter for immunization: Secondary | ICD-10-CM | POA: Diagnosis not present

## 2017-08-21 DIAGNOSIS — L720 Epidermal cyst: Secondary | ICD-10-CM | POA: Diagnosis not present

## 2017-08-21 DIAGNOSIS — L578 Other skin changes due to chronic exposure to nonionizing radiation: Secondary | ICD-10-CM | POA: Diagnosis not present

## 2017-10-18 ENCOUNTER — Telehealth: Payer: Self-pay | Admitting: Cardiovascular Disease

## 2017-10-18 NOTE — Telephone Encounter (Signed)
New Message   Pt c/o of Chest Pain: STAT if CP now or developed within 24 hours  1. Are you having CP right now? No  2. Are you experiencing any other symptoms (ex. SOB, nausea, vomiting, sweating)? Little SOB  3. How long have you been experiencing CP? 3-4 days  4. Is your CP continuous or coming and going? Coming and going but when it comes it starts for a while.   5. Have you taken Nitroglycerin? no ?

## 2017-10-18 NOTE — Telephone Encounter (Signed)
Thanks. Agree MCr

## 2017-10-18 NOTE — Telephone Encounter (Signed)
Spoke with patient and he started having chest pains about 4-5 days ago. Denies nausea, radiating pain, or shortness of breath with pain. Pain started after he had lifted weights. Pain is not brought on by exertion, has ridden bike without pain. Scheduled office visit with Jory Sims DNP for Thursday 10/26/17 next available. Advised patient to take it easy and go to ED if worse Will forward Dr Sallyanne Kuster for review

## 2017-10-26 ENCOUNTER — Encounter (INDEPENDENT_AMBULATORY_CARE_PROVIDER_SITE_OTHER): Payer: Self-pay

## 2017-10-26 ENCOUNTER — Ambulatory Visit (INDEPENDENT_AMBULATORY_CARE_PROVIDER_SITE_OTHER): Payer: Medicare Other | Admitting: Adult Health

## 2017-10-26 ENCOUNTER — Encounter: Payer: Self-pay | Admitting: Adult Health

## 2017-10-26 ENCOUNTER — Telehealth (HOSPITAL_COMMUNITY): Payer: Self-pay

## 2017-10-26 VITALS — BP 138/90 | HR 51 | Ht 66.5 in | Wt 175.0 lb

## 2017-10-26 DIAGNOSIS — R002 Palpitations: Secondary | ICD-10-CM

## 2017-10-26 DIAGNOSIS — R079 Chest pain, unspecified: Secondary | ICD-10-CM | POA: Diagnosis not present

## 2017-10-26 DIAGNOSIS — R0789 Other chest pain: Secondary | ICD-10-CM

## 2017-10-26 DIAGNOSIS — I10 Essential (primary) hypertension: Secondary | ICD-10-CM

## 2017-10-26 LAB — BASIC METABOLIC PANEL
BUN/Creatinine Ratio: 11 (ref 10–24)
BUN: 10 mg/dL (ref 8–27)
CALCIUM: 9.3 mg/dL (ref 8.6–10.2)
CHLORIDE: 105 mmol/L (ref 96–106)
CO2: 24 mmol/L (ref 20–29)
Creatinine, Ser: 0.92 mg/dL (ref 0.76–1.27)
GFR calc Af Amer: 100 mL/min/{1.73_m2} (ref >59)
GFR, EST NON AFRICAN AMERICAN: 86 mL/min/{1.73_m2} (ref >59)
Glucose: 76 mg/dL (ref 65–99)
POTASSIUM: 4.8 mmol/L (ref 3.5–5.2)
Sodium: 144 mmol/L (ref 134–144)

## 2017-10-26 LAB — HEPATIC FUNCTION PANEL
ALK PHOS: 80 IU/L (ref 39–117)
ALT: 16 IU/L (ref 0–44)
AST: 15 IU/L (ref 0–40)
Albumin: 4.2 g/dL (ref 3.6–4.8)
Bilirubin Total: 0.6 mg/dL (ref 0.0–1.2)
Bilirubin, Direct: 0.15 mg/dL (ref 0.00–0.40)
Total Protein: 6.5 g/dL (ref 6.0–8.5)

## 2017-10-26 LAB — LIPID PANEL
CHOL/HDL RATIO: 2.7 ratio (ref 0.0–5.0)
Cholesterol, Total: 131 mg/dL (ref 100–199)
HDL: 48 mg/dL (ref >39)
LDL Calculated: 69 mg/dL (ref 0–99)
TRIGLYCERIDES: 68 mg/dL (ref 0–149)
VLDL Cholesterol Cal: 14 mg/dL (ref 5–40)

## 2017-10-26 NOTE — Telephone Encounter (Signed)
Encounter complete. 

## 2017-10-26 NOTE — Addendum Note (Signed)
Addended by: Zebedee Iba on: 10/26/2017 04:17 PM   Modules accepted: Orders

## 2017-10-26 NOTE — Patient Instructions (Addendum)
Medication Instructions:   NO CHANGES-Your physician recommends that you continue on your current medications as directed. Please refer to the Current Medication list given to you today.  If you need a refill on your cardiac medications before your next appointment, please call your pharmacy.  Labwork: BMET, LFT AND FLP HERE IN OUR OFFICE AT [facility]  Testing/Procedures: Your physician has requested that you have a exercise myoview-A cardiac stress test is a cardiological test that measures the heart's ability to respond to external stress in a controlled clinical environment. The stress response is induced by exercise. For further information please visit [email or web address]. Please follow instruction sheet, as given.  Follow-Up: Your physician wants you to follow-up in: [place], DNP.    Thank you for choosing [facility]!!

## 2017-10-26 NOTE — Progress Notes (Signed)
Cardiology Office Note    Date:  10/26/2017   ID:  [name], DOB 1951/01/13, MRN [id number]  PCP:  Gaynelle Arabian, MD  Cardiologist:  Dr. Sallyanne Kuster   Chief Complaint  Patient presents with  . Palpitations  . Chest Pain     History of Present Illness: Kenneth Booker is a 67 y.o. male who presents for ongoing assessment and management of hypertension, mild prolapse,  chronic 65 e he was last seen by Dr.Koneswaran in March 2018.   On last office visit primary for discussion of anxiety and need for referral to psychiatry for ongoing symptoms.  No new changes or additions to medications were made.   He is here today with new complaint of tightness and heartburn with and without exertion.  The patient has a strong family history of premature heart disease in his mother who died at age 70 from a massive MI, father died early from lung disease as result of heavy smoking.  According to the patient.  He states that the discomfort occurs with and without exertion, he denies any associated shortness of breath he has had some mild diaphoresis, but denies dizziness or fatigue.   Past Medical History:  Diagnosis Date  . Cartilage tear 2009   right knee  . PONV (postoperative nausea and vomiting)   . Systemic hypertension     Past Surgical History:  Procedure Laterality Date  . CARDIOVASCULAR STRESS TEST  02/20/2002   Negative adequate Bruce protocal exercise stress test, there is scintigraphic evidence of apical thinning with normal dynamic images EF 60% calculated by QGS  . CATARACT EXTRACTION W/PHACO Right 05/16/2013   Procedure: CATARACT EXTRACTION PHACO AND INTRAOCULAR LENS PLACEMENT (IOC);  Surgeon: Tonny Branch, MD;  Location: [facility];  Service: Ophthalmology;  Laterality: Right;  CDE:5.25  . CATARACT EXTRACTION W/PHACO Left 06/03/2013   Procedure: CATARACT EXTRACTION PHACO AND INTRAOCULAR LENS PLACEMENT (IOC);  Surgeon: Tonny Branch, MD;  Location: [facility];  Service: Ophthalmology;   Laterality: Left;  CDE: 6.26  . KNEE CARTILAGE SURGERY Right 2007?  . RENAL DUPLEX  02/20/2003   Bilateral renal arteries demonstrate normal valveswith no hemodynamically significance. Bilateral kidneysare symmetrical in shape with no obvious abnormality visualized.  . TRANSTHORACIC ECHOCARDIOGRAM  03/20/2012   EF >55%, suggestive of borderline impaired LV relaxation with normal tissue doppler, mild MR, mild TR.      Current Outpatient Medications  Medication Sig Dispense Refill  . AVALIDE 150-12.5 MG tablet TAKE 1 TABLET BY MOUTH ONCE A DAY FOR 90 DAYS  1   No current facility-administered medications for this visit.     Allergies:   Tarka [trandolapril-verapamil hcl er] and Norvasc [amlodipine besylate]    Social History:  The patient  reports that  has never smoked. he has never used smokeless tobacco. He reports that he does not drink alcohol or use drugs.   Family History:  The patient's family history includes Cancer in his brother, brother, and father; Cancer (age of onset: 63) in his sister; Diabetes in his paternal grandmother; Heart attack in his mother; Hypertension in his sister and sister.    ROS: All other systems are reviewed and negative. Unless otherwise mentioned in H&P    PHYSICAL EXAM: VS:  BP 138/90   Pulse (!) 51   Ht 5' 6.5" (1.689 m)   Wt 175 lb (79.4 kg)   BMI 27.82 kg/m  , BMI Body mass index is 27.82 kg/m. GEN: Well nourished, well developed, in no acute distress  HEENT: normal  Neck: no JVD, carotid bruits, or masses Cardiac:RRR; no murmurs, rubs, or gallops,no edema  Respiratory:  clear to auscultation bilaterally, normal work of breathing GI: soft, nontender, nondistended, + BS MS: no deformity or atrophy  Skin: warm and dry, no rash Neuro:  Strength and sensation are intact Psych: euthymic mood, full affect   EKG: Sinus bradycardia heart rate of 51 bpm.   Recent Labs: No results found for requested labs within last 8760 hours.    Lipid  Panel No results found for: CHOL, TRIG, HDL, CHOLHDL, VLDL, LDLCALC, LDLDIRECT    Wt Readings from Last 3 Encounters:  10/26/17 175 lb (79.4 kg)  12/30/16 170 lb (77.1 kg)  11/11/16 173 lb 12.8 oz (78.8 kg)     ASSESSMENT AND PLAN:  1.  Recurrent chest pain: Uncertain etiology at this time.  Due to family history of premature heart disease, of so history of hypertension, unknown cholesterol status, nuclear medicine stress test for diagnostic prognostic purposes.  We will also check a BMET to evaluate potassium and kidney status as he is on an antihypertensive which has diuretic.  2.  Hypertension: Currently well controlled.  As stated we will check a BMET to evaluate potassium status.  3.  Unknown cholesterol status: Checking fasting lipids and LFTs.   Patient wishes to be reestablished with Dr. Sallyanne Kuster, as he is seen him in the past that had not followed up.  His most recent office visit was in February 2018 with Dr. Sallyanne Kuster.   Current medicines are reviewed at length with the patient today.    Labs/ tests ordered today include: Stress test. BMET and cholesterol status.   Phill Myron. West Pugh, ANP, AACC   10/26/2017 8:34 AM    [facility] [address], [place], [place] [address] Phone: [phone]; Fax: [phone]

## 2017-10-27 ENCOUNTER — Telehealth (HOSPITAL_COMMUNITY): Payer: Self-pay

## 2017-10-27 NOTE — Telephone Encounter (Signed)
Encounter Complete.  

## 2017-10-27 NOTE — Telephone Encounter (Signed)
Encounter complete. 

## 2017-10-31 ENCOUNTER — Encounter: Payer: Self-pay | Admitting: Adult Health

## 2017-10-31 ENCOUNTER — Ambulatory Visit (HOSPITAL_COMMUNITY)
Admission: RE | Admit: 2017-10-31 | Discharge: 2017-10-31 | Disposition: A | Discharge status: Home / Self Care | Payer: Medicare Other | Source: Ambulatory Visit | Attending: Cardiology | Admitting: Cardiology

## 2017-10-31 DIAGNOSIS — R002 Palpitations: Secondary | ICD-10-CM

## 2017-10-31 DIAGNOSIS — I2510 Atherosclerotic heart disease of native coronary artery without angina pectoris: Secondary | ICD-10-CM | POA: Diagnosis not present

## 2017-10-31 DIAGNOSIS — I10 Essential (primary) hypertension: Secondary | ICD-10-CM | POA: Insufficient documentation

## 2017-10-31 DIAGNOSIS — R0789 Other chest pain: Secondary | ICD-10-CM | POA: Diagnosis not present

## 2017-10-31 DIAGNOSIS — Z8249 Family history of ischemic heart disease and other diseases of the circulatory system: Secondary | ICD-10-CM | POA: Diagnosis not present

## 2017-10-31 DIAGNOSIS — R079 Chest pain, unspecified: Secondary | ICD-10-CM | POA: Diagnosis not present

## 2017-10-31 LAB — MYOCARDIAL PERFUSION IMAGING
CHL CUP MPHR: 154 {beats}/min
CHL CUP NUCLEAR SRS: 1
CHL CUP NUCLEAR SSS: 2
CHL CUP RESTING HR STRESS: 45 {beats}/min
CSEPED: 9 min
CSEPEW: 10.4 METS
CSEPHR: 92 %
CSEPPHR: 142 {beats}/min
Exercise duration (sec): 30 s
LV dias vol: 136 mL (ref 62–150)
LV sys vol: 62 mL
RPE: 16
SDS: 1
TID: 1.01

## 2017-10-31 MED ORDER — TECHNETIUM TC 99M TETROFOSMIN IV KIT
10.1000 | PACK | Freq: Once | INTRAVENOUS | Status: AC | PRN
  Administered 2017-10-31: 10.1 via INTRAVENOUS
  Filled 2017-10-31: qty 11

## 2017-10-31 MED ORDER — TECHNETIUM TC 99M TETROFOSMIN IV KIT
30.1000 | PACK | Freq: Once | INTRAVENOUS | Status: AC | PRN
  Administered 2017-10-31: 30.1 via INTRAVENOUS
  Filled 2017-10-31: qty 31

## 2017-11-06 NOTE — Progress Notes (Signed)
Cardiology Office Note   Date:  11/07/2017   ID:  LEDGER HEINDL, DOB [birth year], MRN [id number]  PCP:  Gaynelle Arabian, MD  Cardiologist: Dr.Croitoru  Chief Complaint  Patient presents with  . Follow-up  . Shortness of Breath     History of Present Illness: Kenneth Booker is a 67 y.o. male who presents for ongoing assessment and management of hypertension, history of mitral prolapse, chronic kidney disease.  He was last seen in the office on 10/26/2017 for complaints of dyspnea on exertion and chest discomfort.  On that visit, the patient was scheduled for nuclear medicine stress test for diagnostic prognostic purposes, BMET was ordered as he was also on a diuretic and for risk stratification recheck fasting lipids and LFTs.  Study Highlights     The left ventricular ejection fraction is mildly decreased (45-54%).  Nuclear stress EF: 54%.  Blood pressure demonstrated a hypertensive response to exercise.  There was no ST segment deviation noted during stress.  The study is normal.  This is a low risk study.   Normal resting and stress perfusion. No ischemia or infarction EF 54%   Labs: Sodium 144; potassium 4.8; chloride 105; CO2 24; glucose 76; BUN 18; creatinine 0.92.           Total cholesterol 131, HDL 48, LDL 69, triglycerides 68.  LFTs were within normal limits.  Comes today without any further complaints.  He did notice during the stress test that his blood pressure was very elevated greater than 200 at maximum heart rate.  He had taken his medication that day.  He stated the rest of the day his blood pressure was elevated he does bring with him copies of his blood pressures blood pressure 160/80-142/72  range.  The following day however blood pressures normalized and he did just fine.  Has had no further complaints of discomfort in his chest or heartburn feeling.  Past Medical History:  Diagnosis Date  . Cartilage tear 2009   right knee  . PONV (postoperative  nausea and vomiting)   . Systemic hypertension     Past Surgical History:  Procedure Laterality Date  . CARDIOVASCULAR STRESS TEST  02/20/2002   Negative adequate Bruce protocal exercise stress test, there is scintigraphic evidence of apical thinning with normal dynamic images EF 60% calculated by QGS  . CATARACT EXTRACTION W/PHACO Right 05/16/2013   Procedure: CATARACT EXTRACTION PHACO AND INTRAOCULAR LENS PLACEMENT (IOC);  Surgeon: Tonny Branch, MD;  Location: [facility];  Service: Ophthalmology;  Laterality: Right;  CDE:5.25  . CATARACT EXTRACTION W/PHACO Left 06/03/2013   Procedure: CATARACT EXTRACTION PHACO AND INTRAOCULAR LENS PLACEMENT (IOC);  Surgeon: Tonny Branch, MD;  Location: [facility];  Service: Ophthalmology;  Laterality: Left;  CDE: 6.26  . KNEE CARTILAGE SURGERY Right 2007?  . RENAL DUPLEX  02/20/2003   Bilateral renal arteries demonstrate normal valveswith no hemodynamically significance. Bilateral kidneysare symmetrical in shape with no obvious abnormality visualized.  . TRANSTHORACIC ECHOCARDIOGRAM  03/20/2012   EF >55%, suggestive of borderline impaired LV relaxation with normal tissue doppler, mild MR, mild TR.      Current Outpatient Medications  Medication Sig Dispense Refill  . AVALIDE 150-12.5 MG tablet TAKE 1 TABLET BY MOUTH ONCE A DAY FOR 90 DAYS  1   No current facility-administered medications for this visit.     Allergies:   Tarka [trandolapril-verapamil hcl er] and Norvasc [amlodipine besylate]    Social History:  The patient  reports that  has never  smoked. he has never used smokeless tobacco. He reports that he does not drink alcohol or use drugs.   Family History:  The patient's family history includes Cancer in his brother, brother, and father; Cancer (age of onset: 24) in his sister; Diabetes in his paternal grandmother; Heart attack in his mother; Hypertension in his sister and sister.    ROS: All other systems are reviewed and negative. Unless otherwise  mentioned in H&P    PHYSICAL EXAM: VS:  There were no vitals taken for this visit. , BMI There is no height or weight on file to calculate BMI. GEN: Well nourished, well developed, in no acute distress  HEENT: normal  Neck: no JVD, carotid bruits, or masses Cardiac: RRR; no murmurs, rubs, or gallops,no edema  Respiratory:  clear to auscultation bilaterally, normal work of breathing GI: soft, bowel tenderness in the right upper quadrant region but no true Murphy sign, nondistended, + BS MS: no deformity or atrophy  Skin: warm and dry, no rash Neuro:  Strength and sensation are intact Psych: euthymic mood, full affect    Recent Labs: 10/26/2017: ALT 16; BUN 10; Creatinine, Ser 0.92; Potassium 4.8; Sodium 144    Lipid Panel    Component Value Date/Time   CHOL 131 10/26/2017 0853   TRIG 68 10/26/2017 0853   HDL 48 10/26/2017 0853   CHOLHDL 2.7 10/26/2017 0853   LDLCALC 69 10/26/2017 0853      Wt Readings from Last 3 Encounters:  10/31/17 175 lb (79.4 kg)  10/26/17 175 lb (79.4 kg)  12/30/16 170 lb (77.1 kg)     ASSESSMENT AND PLAN:  1.  Chest discomfort: Nuclear medicine stress test is reassuring and found to be low risk.  The patient remains active and has had very little recurrence of discomfort.  It was noted that he had had some heartburn symptoms.  I did check abdomen, evaluate for Murphy sign.  He does have some tenderness that not a true Murphy sign causing significant pain.  I have advised him to follow-up with his PCP or GI physician for further testing to evaluate for gallbladder disease or GERD symptoms.  Advised to cut back on ice tea which she is drinking throughout the day to avoid the acidity and caffeine.  2.  Hypertension: Currently well controlled on Avalide.  It was noted, during nuclear medicine stress test that he did have a hypertensive response to exercise.  Blood pressure was elevated the rest of the day, but has subsequently leveled out and is stable  now.  I have asked him to continue to monitor his blood pressure periodically especially if he starts to feel discomfort and pressure in his chest to evaluate if it is elevated at that time. May need to titrate medications if necessary.   Current medicines are reviewed at length with the patient today.  I have given him copies of his recent labs and stress test results to take to PCP as he is due to see him in the next couple of weeks.  Labs/ tests ordered today include: None.  Phill Myron. West Pugh, ANP, AACC   11/07/2017 8:02 AM    [facility] [address] [place], [place] [address] Phone: [phone]; Fax: [phone]

## 2017-11-07 ENCOUNTER — Ambulatory Visit (INDEPENDENT_AMBULATORY_CARE_PROVIDER_SITE_OTHER): Payer: Medicare Other | Admitting: Adult Health

## 2017-11-07 ENCOUNTER — Encounter: Payer: Self-pay | Admitting: Adult Health

## 2017-11-07 VITALS — BP 139/79 | HR 65 | Ht 66.5 in | Wt 176.2 lb

## 2017-11-07 DIAGNOSIS — R0789 Other chest pain: Secondary | ICD-10-CM | POA: Diagnosis not present

## 2017-11-07 DIAGNOSIS — I10 Essential (primary) hypertension: Secondary | ICD-10-CM | POA: Diagnosis not present

## 2017-11-07 NOTE — Progress Notes (Signed)
Thank you MCr 

## 2017-11-07 NOTE — Patient Instructions (Signed)
Medication Instructions:  Your physician recommends that you continue on your current medications as directed. Please refer to the Current Medication list given to you today.  Follow-Up: Reschedule your appt with Dr. Sallyanne Kuster at checkout.  Any Other Special Instructions Will Be Listed Below (If Applicable).     If you need a refill on your cardiac medications before your next appointment, please call your pharmacy.

## 2017-12-11 DIAGNOSIS — Z0000 Encounter for general adult medical examination without abnormal findings: Secondary | ICD-10-CM | POA: Diagnosis not present

## 2017-12-11 DIAGNOSIS — I10 Essential (primary) hypertension: Secondary | ICD-10-CM | POA: Diagnosis not present

## 2017-12-11 DIAGNOSIS — Z1159 Encounter for screening for other viral diseases: Secondary | ICD-10-CM | POA: Diagnosis not present

## 2017-12-11 DIAGNOSIS — Z23 Encounter for immunization: Secondary | ICD-10-CM | POA: Diagnosis not present

## 2018-01-15 ENCOUNTER — Ambulatory Visit: Payer: Medicare HMO | Admitting: Cardiovascular Disease

## 2018-01-15 ENCOUNTER — Encounter: Payer: Self-pay | Admitting: Cardiovascular Disease

## 2018-01-15 VITALS — BP 126/84 | HR 48 | Ht 66.0 in | Wt 175.2 lb

## 2018-01-15 DIAGNOSIS — I10 Essential (primary) hypertension: Secondary | ICD-10-CM

## 2018-01-15 DIAGNOSIS — I341 Nonrheumatic mitral (valve) prolapse: Secondary | ICD-10-CM

## 2018-01-15 DIAGNOSIS — R002 Palpitations: Secondary | ICD-10-CM

## 2018-01-15 NOTE — Progress Notes (Signed)
Patient ID: Kenneth Booker, male   DOB: 1951/02/17, 67 y.o.   MRN: [id number]    Cardiology Office Note    Date:  01/17/2018   ID:  Kenneth Booker, DOB 10/04/1951, MRN [id number]  PCP:  Gaynelle Arabian, MD  Cardiologist:   Sanda Klein, MD   Chief Complaint  Patient presents with  . Palpitations    History of Present Illness:  Kenneth Booker is a 67 y.o. male with hypertension and mild mitral valve prolapse and palpitations.   He generally feels quite well.  He had a lot of problems with palpitations towards the year, but these have subsided.  The patient specifically denies any chest pain at rest exertion, dyspnea at rest or with exertion, orthopnea, paroxysmal nocturnal dyspnea, syncope, palpitations, focal neurological deficits, intermittent claudication, lower extremity edema, unexplained weight gain, cough, hemoptysis or wheezing.   Past Medical History:  Diagnosis Date  . Cartilage tear 2009   right knee  . PONV (postoperative nausea and vomiting)   . Systemic hypertension     Past Surgical History:  Procedure Laterality Date  . CARDIOVASCULAR STRESS TEST  02/20/2002   Negative adequate Bruce protocal exercise stress test, there is scintigraphic evidence of apical thinning with normal dynamic images EF 60% calculated by QGS  . CATARACT EXTRACTION W/PHACO Right 05/16/2013   Procedure: CATARACT EXTRACTION PHACO AND INTRAOCULAR LENS PLACEMENT (IOC);  Surgeon: Tonny Branch, MD;  Location: [facility];  Service: Ophthalmology;  Laterality: Right;  CDE:5.25  . CATARACT EXTRACTION W/PHACO Left 06/03/2013   Procedure: CATARACT EXTRACTION PHACO AND INTRAOCULAR LENS PLACEMENT (IOC);  Surgeon: Tonny Branch, MD;  Location: [facility];  Service: Ophthalmology;  Laterality: Left;  CDE: 6.26  . KNEE CARTILAGE SURGERY Right 2007?  . RENAL DUPLEX  02/20/2003   Bilateral renal arteries demonstrate normal valveswith no hemodynamically significance. Bilateral kidneysare symmetrical in shape with no  obvious abnormality visualized.  . TRANSTHORACIC ECHOCARDIOGRAM  03/20/2012   EF >55%, suggestive of borderline impaired LV relaxation with normal tissue doppler, mild MR, mild TR.     Outpatient Medications Prior to Visit  Medication Sig Dispense Refill  . AVALIDE 150-12.5 MG tablet TAKE 1 TABLET BY MOUTH ONCE A DAY FOR 90 DAYS  1   No facility-administered medications prior to visit.      Allergies:   Tarka [trandolapril-verapamil hcl er] and Norvasc [amlodipine besylate]   Social History   Socioeconomic History  . Marital status: Married    Spouse name: Not on file  . Number of children: Not on file  . Years of education: Not on file  . Highest education level: Not on file  Occupational History  . Not on file  Social Needs  . Financial resource strain: Not on file  . Food insecurity:    Worry: Not on file    Inability: Not on file  . Transportation needs:    Medical: Not on file    Non-medical: Not on file  Tobacco Use  . Smoking status: Never Smoker  . Smokeless tobacco: Never Used  Substance and Sexual Activity  . Alcohol use: No  . Drug use: No  . Sexual activity: Not on file  Lifestyle  . Physical activity:    Days per week: Not on file    Minutes per session: Not on file  . Stress: Not on file  Relationships  . Social connections:    Talks on phone: Not on file    Gets together: Not on file  Attends religious service: Not on file    Active member of club or organization: Not on file    Attends meetings of clubs or organizations: Not on file    Relationship status: Not on file  Other Topics Concern  . Not on file  Social History Narrative  . Not on file     Family History:  The patient's family history includes Cancer in his brother, brother, and father; Cancer (age of onset: 30) in his sister; Diabetes in his paternal grandmother; Heart attack in his mother; Hypertension in his sister and sister.   ROS:   Please see the history of present illness.     ROS All other systems reviewed and are negative.   PHYSICAL EXAM:   VS:  BP 126/84   Pulse (!) 48   Ht 5\' 6"  (1.676 m)   Wt 175 lb 3.2 oz (79.5 kg)   SpO2 100%   BMI 28.28 kg/m     General: Alert, oriented x3, no distress, looks muscular but also somewhat overweight Head: no evidence of trauma, PERRL, EOMI, no exophtalmos or lid lag, no myxedema, no xanthelasma; normal ears, nose and oropharynx Neck: normal jugular venous pulsations and no hepatojugular reflux; brisk carotid pulses without delay and no carotid bruits Chest: clear to auscultation, no signs of consolidation by percussion or palpation, normal fremitus, symmetrical and full respiratory excursions Cardiovascular: normal position and quality of the apical impulse, regular rhythm, normal first and second heart sounds, no murmurs, rubs or gallops.  He has a late systolic click but becomes very obvious from the Valsalva maneuver but does not have an accompanying murmur even with provocation. Abdomen: no tenderness or distention, no masses by palpation, no abnormal pulsatility or arterial bruits, normal bowel sounds, no hepatosplenomegaly Extremities: no clubbing, cyanosis or edema; 2+ radial, ulnar and brachial pulses bilaterally; 2+ right femoral, posterior tibial and dorsalis pedis pulses; 2+ left femoral, posterior tibial and dorsalis pedis pulses; no subclavian or femoral bruits Neurological: grossly nonfocal Psych: Normal mood and affect   Wt Readings from Last 3 Encounters:  01/15/18 175 lb 3.2 oz (79.5 kg)  11/07/17 176 lb 3.2 oz (79.9 kg)  10/31/17 175 lb (79.4 kg)      Studies/Labs Reviewed:   EKG:  EKG is not ordered today.  The ekg ordered in January demonstrates mild sinus bradycardia, normal tracing. QTc 407 ms.    ASSESSMENT:    1. Palpitations   2. Essential hypertension   3. Mitral valve prolapse      PLAN:  In order of problems listed above:  1. Palpitations: Substantially improved, no  longer symptomatic 2. HTN: Excellent blood pressure control, would still benefit from weight loss. 3. MVP: Distant mitral valve systolic click without murmur.  Previous echo showed only mild mitral insufficiency.  No further evaluation necessary at this time.    Medication Adjustments/Labs and Tests Ordered: Current medicines are reviewed at length with the patient today.  Concerns regarding medicines are outlined above.  Medication changes, Labs and Tests ordered today are listed in the Patient Instructions below. Patient Instructions  Dr Sallyanne Kuster recommends that you schedule a follow-up appointment in 12 months. You will receive a reminder letter in the mail two months in advance. If you don't receive a letter, please call our office to schedule the follow-up appointment.  If you need a refill on your cardiac medications before your next appointment, please call your pharmacy.      Signed, Sanda Klein, MD  01/17/2018  1:17 PM    [facility] [place], [place], [place]  [address] Phone: [phone]; Fax: [phone]

## 2018-01-15 NOTE — Patient Instructions (Signed)
Dr Croitoru recommends that you schedule a follow-up appointment in 12 months. You will receive a reminder letter in the mail two months in advance. If you don't receive a letter, please call our office to schedule the follow-up appointment.  If you need a refill on your cardiac medications before your next appointment, please call your pharmacy. 

## 2018-01-17 ENCOUNTER — Encounter: Payer: Self-pay | Admitting: Cardiovascular Disease

## 2018-02-19 DIAGNOSIS — L720 Epidermal cyst: Secondary | ICD-10-CM | POA: Diagnosis not present

## 2018-02-19 DIAGNOSIS — D225 Melanocytic nevi of trunk: Secondary | ICD-10-CM | POA: Diagnosis not present

## 2018-02-19 DIAGNOSIS — Z1283 Encounter for screening for malignant neoplasm of skin: Secondary | ICD-10-CM | POA: Diagnosis not present

## 2018-06-12 DIAGNOSIS — R69 Illness, unspecified: Secondary | ICD-10-CM | POA: Diagnosis not present

## 2018-07-06 DIAGNOSIS — Z87891 Personal history of nicotine dependence: Secondary | ICD-10-CM | POA: Diagnosis not present

## 2018-07-06 DIAGNOSIS — Z7722 Contact with and (suspected) exposure to environmental tobacco smoke (acute) (chronic): Secondary | ICD-10-CM | POA: Diagnosis not present

## 2018-07-06 DIAGNOSIS — Z888 Allergy status to other drugs, medicaments and biological substances status: Secondary | ICD-10-CM | POA: Diagnosis not present

## 2018-07-06 DIAGNOSIS — Z87892 Personal history of anaphylaxis: Secondary | ICD-10-CM | POA: Diagnosis not present

## 2018-07-06 DIAGNOSIS — Z809 Family history of malignant neoplasm, unspecified: Secondary | ICD-10-CM | POA: Diagnosis not present

## 2018-07-06 DIAGNOSIS — I10 Essential (primary) hypertension: Secondary | ICD-10-CM | POA: Diagnosis not present

## 2018-07-06 DIAGNOSIS — Z803 Family history of malignant neoplasm of breast: Secondary | ICD-10-CM | POA: Diagnosis not present

## 2018-07-06 DIAGNOSIS — Z8249 Family history of ischemic heart disease and other diseases of the circulatory system: Secondary | ICD-10-CM | POA: Diagnosis not present

## 2018-11-01 DIAGNOSIS — H5203 Hypermetropia, bilateral: Secondary | ICD-10-CM | POA: Diagnosis not present

## 2018-11-01 DIAGNOSIS — Z0100 Encounter for examination of eyes and vision without abnormal findings: Secondary | ICD-10-CM | POA: Diagnosis not present

## 2018-11-01 DIAGNOSIS — H52229 Regular astigmatism, unspecified eye: Secondary | ICD-10-CM | POA: Diagnosis not present

## 2018-11-05 DIAGNOSIS — C44311 Basal cell carcinoma of skin of nose: Secondary | ICD-10-CM | POA: Diagnosis not present

## 2018-11-05 DIAGNOSIS — L720 Epidermal cyst: Secondary | ICD-10-CM | POA: Diagnosis not present

## 2018-12-27 DIAGNOSIS — Z85828 Personal history of other malignant neoplasm of skin: Secondary | ICD-10-CM | POA: Diagnosis not present

## 2018-12-27 DIAGNOSIS — Z08 Encounter for follow-up examination after completed treatment for malignant neoplasm: Secondary | ICD-10-CM | POA: Diagnosis not present

## 2018-12-27 DIAGNOSIS — L720 Epidermal cyst: Secondary | ICD-10-CM | POA: Diagnosis not present

## 2018-12-27 DIAGNOSIS — C44311 Basal cell carcinoma of skin of nose: Secondary | ICD-10-CM | POA: Diagnosis not present

## 2019-01-17 ENCOUNTER — Ambulatory Visit: Payer: Medicare HMO | Admitting: Cardiovascular Disease

## 2019-04-15 DIAGNOSIS — I10 Essential (primary) hypertension: Secondary | ICD-10-CM | POA: Diagnosis not present

## 2019-04-15 DIAGNOSIS — Z0000 Encounter for general adult medical examination without abnormal findings: Secondary | ICD-10-CM | POA: Diagnosis not present

## 2019-04-15 DIAGNOSIS — Z125 Encounter for screening for malignant neoplasm of prostate: Secondary | ICD-10-CM | POA: Diagnosis not present

## 2019-04-15 DIAGNOSIS — Z1389 Encounter for screening for other disorder: Secondary | ICD-10-CM | POA: Diagnosis not present

## 2019-04-18 DIAGNOSIS — Z125 Encounter for screening for malignant neoplasm of prostate: Secondary | ICD-10-CM | POA: Diagnosis not present

## 2019-04-18 DIAGNOSIS — I10 Essential (primary) hypertension: Secondary | ICD-10-CM | POA: Diagnosis not present

## 2019-04-29 ENCOUNTER — Telehealth: Payer: Self-pay | Admitting: Cardiovascular Disease

## 2019-04-29 ENCOUNTER — Telehealth: Payer: Self-pay | Admitting: *Deleted

## 2019-04-29 NOTE — Telephone Encounter (Signed)
I called pt to confirm appt for 7=21-20.        COVID-19 Pre-Screening Questions:   In the past 7 to 10 days have you had a cough,  shortness of breath, headache, congestion, fever (100 or greater) body aches, chills, sore throat, or sudden loss of taste or sense of smell? noHave you been around anyone with known Covid 19.  Have you been around anyone who is awaiting Covid 19 test results in the past 7 to 10 days? no  Have you been around anyone who has been exposed to Covid 19, or has mentioned symptoms of Covid 19 within the past 7 to 10 days? no  If you have any concerns/questions about symptoms patients report during screening (either on the phone or at threshold). Contact the provider seeing the patient or DOD for further guidance.  If neither are available contact a member of the leadership team.

## 2019-04-29 NOTE — Telephone Encounter (Signed)

## 2019-04-30 ENCOUNTER — Other Ambulatory Visit: Payer: Self-pay

## 2019-04-30 ENCOUNTER — Ambulatory Visit: Payer: Medicare HMO | Admitting: Cardiovascular Disease

## 2019-04-30 ENCOUNTER — Encounter: Payer: Self-pay | Admitting: Cardiovascular Disease

## 2019-04-30 VITALS — BP 128/88 | HR 55 | Ht 66.0 in | Wt 172.2 lb

## 2019-04-30 DIAGNOSIS — I341 Nonrheumatic mitral (valve) prolapse: Secondary | ICD-10-CM

## 2019-04-30 DIAGNOSIS — I10 Essential (primary) hypertension: Secondary | ICD-10-CM

## 2019-04-30 DIAGNOSIS — R002 Palpitations: Secondary | ICD-10-CM

## 2019-04-30 DIAGNOSIS — Z8249 Family history of ischemic heart disease and other diseases of the circulatory system: Secondary | ICD-10-CM

## 2019-04-30 NOTE — Patient Instructions (Signed)

## 2019-04-30 NOTE — Progress Notes (Signed)
Patient ID: Kenneth Booker, male   DOB: 1951-07-01, 68 y.o.   MRN: [id number]    Cardiology Office Note    Date:  04/30/2019   ID:  Kenneth Booker, DOB Mar 11, 1951, MRN [id number]  PCP:  Gaynelle Arabian, MD  Cardiologist:   Sanda Klein, MD   Chief Complaint  Patient presents with  . Mitral Valve Prolapse  . Hypertension    History of Present Illness:  Kenneth Booker is a 68 y.o. male with hypertension and mild mitral valve prolapse and palpitations.   He remains physically active.  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  He has occasional palpitations but these are not associated or other cardiovascular complaints.  The patient specifically denies any chest pain at rest exertion, dyspnea at rest or with exertion, orthopnea, paroxysmal nocturnal dyspnea, syncope, increase in palpitations, focal neurological deficits, intermittent claudication, lower extremity edema, unexplained weight gain, cough, hemoptysis or wheezing.   Past Medical History:  Diagnosis Date  . Cartilage tear 2009   right knee  . PONV (postoperative nausea and vomiting)   . Systemic hypertension     Past Surgical History:  Procedure Laterality Date  . CARDIOVASCULAR STRESS TEST  02/20/2002   Negative adequate Bruce protocal exercise stress test, there is scintigraphic evidence of apical thinning with normal dynamic images EF 60% calculated by QGS  . CATARACT EXTRACTION W/PHACO Right 05/16/2013   Procedure: CATARACT EXTRACTION PHACO AND INTRAOCULAR LENS PLACEMENT (IOC);  Surgeon: Tonny Branch, MD;  Location: [facility];  Service: Ophthalmology;  Laterality: Right;  CDE:5.25  . CATARACT EXTRACTION W/PHACO Left 06/03/2013   Procedure: CATARACT EXTRACTION PHACO AND INTRAOCULAR LENS PLACEMENT (IOC);  Surgeon: Tonny Branch, MD;  Location: [facility];  Service: Ophthalmology;  Laterality: Left;  CDE: 6.26  . KNEE CARTILAGE SURGERY Right 2007?  . RENAL DUPLEX  02/20/2003    Bilateral renal arteries demonstrate normal valveswith no hemodynamically significance. Bilateral kidneysare symmetrical in shape with no obvious abnormality visualized.  . TRANSTHORACIC ECHOCARDIOGRAM  03/20/2012   EF >55%, suggestive of borderline impaired LV relaxation with normal tissue doppler, mild MR, mild TR.     Outpatient Medications Prior to Visit  Medication Sig Dispense Refill  . AVALIDE 150-12.5 MG tablet TAKE 1 TABLET BY MOUTH ONCE A DAY FOR 90 DAYS  1   No facility-administered medications prior to visit.      Allergies:   Tarka [trandolapril-verapamil hcl er] and Norvasc [amlodipine besylate]   Social History   Socioeconomic History  . Marital status: Married    Spouse name: Not on file  . Number of children: Not on file  . Years of education: Not on file  . Highest education level: Not on file  Occupational History  . Not on file  Social Needs  . Financial resource strain: Not on file  . Food insecurity    Worry: Not on file    Inability: Not on file  . Transportation needs    Medical: Not on file    Non-medical: Not on file  Tobacco Use  . Smoking status: Never Smoker  . Smokeless tobacco: Never Used  Substance and Sexual Activity  . Alcohol use: No  . Drug use: No  . Sexual activity: Not on file  Lifestyle  . Physical activity    Days per week: Not on file    Minutes per session: Not on file  . Stress: Not on file  Relationships  .  Social Herbalist on phone: Not on file    Gets together: Not on file    Attends religious service: Not on file    Active member of club or organization: Not on file    Attends meetings of clubs or organizations: Not on file    Relationship status: Not on file  Other Topics Concern  . Not on file  Social History Narrative  . Not on file     Family History:  The patient's family history includes Cancer in his brother, brother, and father; Cancer (age of onset: 18) in his sister; Diabetes in his paternal  grandmother; Heart attack in his mother; Hypertension in his sister and sister.   ROS:   Please see the history of present illness.    ROS  All other systems are reviewed and are negative   PHYSICAL EXAM:   VS:  BP 128/88   Pulse (!) 55   Ht 5\' 6"  (1.676 m)   Wt 172 lb 3.2 oz (78.1 kg)   BMI 27.79 kg/m      General: Alert, oriented x3, no distress, he looks fit, slightly overweight Head: no evidence of trauma, PERRL, EOMI, no exophtalmos or lid lag, no myxedema, no xanthelasma; normal ears, nose and oropharynx Neck: normal jugular venous pulsations and no hepatojugular reflux; brisk carotid pulses without delay and no carotid bruits Chest: clear to auscultation, no signs of consolidation by percussion or palpation, normal fremitus, symmetrical and full respiratory excursions Cardiovascular: normal position and quality of the apical impulse, regular rhythm, normal first and second heart sounds, midsystolic click becomes much more obvious after the Valsalva maneuver, but there is no murmur even with provocative maneuvers, no diastolic murmurs, rubs or gallops Abdomen: no tenderness or distention, no masses by palpation, no abnormal pulsatility or arterial bruits, normal bowel sounds, no hepatosplenomegaly Extremities: no clubbing, cyanosis or edema; 2+ radial, ulnar and brachial pulses bilaterally; 2+ right femoral, posterior tibial and dorsalis pedis pulses; 2+ left femoral, posterior tibial and dorsalis pedis pulses; no subclavian or femoral bruits Neurological: grossly nonfocal Psych: Normal mood and affect   Wt Readings from Last 3 Encounters:  04/30/19 172 lb 3.2 oz (78.1 kg)  01/15/18 175 lb 3.2 oz (79.5 kg)  11/07/17 176 lb 3.2 oz (79.9 kg)      Studies/Labs Reviewed:   EKG:  EKG is ordered today.  It shows mild sinus bradycardia, normal tracing.  Lipid Panel     Component Value Date/Time   CHOL 131 10/26/2017 0853   TRIG 68 10/26/2017 0853   HDL 48 10/26/2017 0853    CHOLHDL 2.7 10/26/2017 0853   LDLCALC 69 10/26/2017 0853   BMET    Component Value Date/Time   NA 144 10/26/2017 0853   K 4.8 10/26/2017 0853   CL 105 10/26/2017 0853   CO2 24 10/26/2017 0853   GLUCOSE 76 10/26/2017 0853   GLUCOSE 94 06/11/2013 1845   BUN 10 10/26/2017 0853   CREATININE 0.92 10/26/2017 0853   CALCIUM 9.3 10/26/2017 0853   GFRNONAA 86 10/26/2017 0853   GFRAA 100 10/26/2017 0853   04/19/2019 potassium 4.2, creatinine 0.9  ASSESSMENT:    1. MVP (mitral valve prolapse)   2. Essential hypertension   3. Heart palpitations   4. Family history of early CAD      PLAN:  In order of problems listed above:  1. Palpitations: Rarely symptomatic.  Probably MVP related premature ventricular beats.  He does not want to  take medicines and his underlying bradycardia would preclude beta-blockers anyway. 2. HTN: Good control.  Mildly elevated diastolic blood pressure, no change in medicines.  He is physically active and pays attention to his diet.  Recently checked potassium and creatinine were normal 3. MVP: Distinct midsystolic click without murmur.  Previous echo showed only mild mitral insufficiency.  No plan for further evaluation unless he develops symptoms or if he develops a loud murmur.   Medication Adjustments/Labs and Tests Ordered: Current medicines are reviewed at length with the patient today.  Concerns regarding medicines are outlined above.  Medication changes, Labs and Tests ordered today are listed in the Patient Instructions below. There are no Patient Instructions on file for this visit.     Signed, Sanda Klein, MD  04/30/2019 8:41 AM    [facility] [address], [place], [place]  [address] Phone: [phone]; Fax: [phone]

## 2019-05-20 DIAGNOSIS — R972 Elevated prostate specific antigen [PSA]: Secondary | ICD-10-CM | POA: Diagnosis not present

## 2019-07-18 DIAGNOSIS — R69 Illness, unspecified: Secondary | ICD-10-CM | POA: Diagnosis not present

## 2019-07-24 DIAGNOSIS — R972 Elevated prostate specific antigen [PSA]: Secondary | ICD-10-CM | POA: Diagnosis not present

## 2019-08-08 DIAGNOSIS — L720 Epidermal cyst: Secondary | ICD-10-CM | POA: Diagnosis not present

## 2019-08-08 DIAGNOSIS — C44311 Basal cell carcinoma of skin of nose: Secondary | ICD-10-CM | POA: Diagnosis not present

## 2019-08-08 DIAGNOSIS — Z08 Encounter for follow-up examination after completed treatment for malignant neoplasm: Secondary | ICD-10-CM | POA: Diagnosis not present

## 2019-08-08 DIAGNOSIS — Z85828 Personal history of other malignant neoplasm of skin: Secondary | ICD-10-CM | POA: Diagnosis not present

## 2019-09-13 DIAGNOSIS — R972 Elevated prostate specific antigen [PSA]: Secondary | ICD-10-CM | POA: Diagnosis not present

## 2019-09-13 DIAGNOSIS — C61 Malignant neoplasm of prostate: Secondary | ICD-10-CM | POA: Diagnosis not present

## 2019-09-24 DIAGNOSIS — C61 Malignant neoplasm of prostate: Secondary | ICD-10-CM | POA: Diagnosis not present

## 2019-09-30 ENCOUNTER — Other Ambulatory Visit: Payer: Self-pay | Admitting: Urology

## 2019-10-14 DIAGNOSIS — C61 Malignant neoplasm of prostate: Secondary | ICD-10-CM | POA: Diagnosis not present

## 2019-10-14 DIAGNOSIS — M6281 Muscle weakness (generalized): Secondary | ICD-10-CM | POA: Diagnosis not present

## 2019-10-14 DIAGNOSIS — M62838 Other muscle spasm: Secondary | ICD-10-CM | POA: Diagnosis not present

## 2019-10-14 DIAGNOSIS — M6289 Other specified disorders of muscle: Secondary | ICD-10-CM | POA: Diagnosis not present

## 2019-10-28 DIAGNOSIS — N393 Stress incontinence (female) (male): Secondary | ICD-10-CM | POA: Diagnosis not present

## 2019-10-28 DIAGNOSIS — M62838 Other muscle spasm: Secondary | ICD-10-CM | POA: Diagnosis not present

## 2019-10-28 DIAGNOSIS — M6281 Muscle weakness (generalized): Secondary | ICD-10-CM | POA: Diagnosis not present

## 2019-11-06 NOTE — Patient Instructions (Addendum)
DUE TO COVID-19 ONLY ONE VISITOR IS ALLOWED TO COME WITH YOU AND STAY IN THE WAITING ROOM ONLY DURING PRE OP AND PROCEDURE DAY OF SURGERY. THE 1 VISITOR MAY VISIT WITH YOU AFTER SURGERY IN YOUR PRIVATE ROOM DURING VISITING HOURS ONLY!  YOU NEED TO HAVE A COVID 19 TEST ON: 11/07/19 @ 11:00 am, THIS TEST MUST BE DONE BEFORE SURGERY, COME  [facility], [address] , [address].  ([place]) ONCE YOUR COVID TEST IS COMPLETED, PLEASE BEGIN THE QUARANTINE INSTRUCTIONS AS OUTLINED IN YOUR HANDOUT.                RONNALD BIDO     Your procedure is scheduled on: 11/11/19   Report to [facility] Main  Entrance   Report to admitting at: 9:15 AM     Call this number if you have problems the morning of surgery [phone]    Remember: Do not eat food or drink liquids :After Midnight.   BRUSH YOUR TEETH MORNING OF SURGERY AND RINSE YOUR MOUTH OUT, NO CHEWING GUM CANDY OR MINTS.     :                                 You may not have any metal on your body including hair pins and              piercings  Do not wear jewelry,lotions, powders or perfumes, deodorant             Men may shave face and neck.   Do not bring valuables to the hospital. [place].  Contacts, dentures or bridgework may not be worn into surgery.  Leave suitcase in the car. After surgery it may be brought to your room.     Patients discharged the day of surgery will not be allowed to drive home. IF YOU ARE HAVING SURGERY AND GOING HOME THE SAME DAY, YOU MUST HAVE AN ADULT TO DRIVE YOU HOME AND BE WITH YOU FOR 24 HOURS. YOU MAY GO HOME BY TAXI OR UBER OR ORTHERWISE, BUT AN ADULT MUST ACCOMPANY YOU HOME AND STAY WITH YOU FOR 24 HOURS.  Name and phone number of your driver:  Special Instructions: N/A              Please read over the following fact sheets you were  given: _____________________________________________________________________             [facility] - Preparing for Surgery Before surgery, you can play an important role.  Because skin is not sterile, your skin needs to be as free of germs as possible.  You can reduce the number of germs on your skin by washing with CHG (chlorahexidine gluconate) soap before surgery.  CHG is an antiseptic cleaner which kills germs and bonds with the skin to continue killing germs even after washing. Please DO NOT use if you have an allergy to CHG or antibacterial soaps.  If your skin becomes reddened/irritated stop using the CHG and inform your nurse when you arrive at Short Stay. Do not shave (including legs and underarms) for at least 48 hours prior to the first CHG shower.  You may shave your face/neck. Please follow these instructions carefully:  1.  Shower with CHG Soap the night before surgery and  the  morning of Surgery.  2.  If you choose to wash your hair, wash your hair first as usual with your  normal  shampoo.  3.  After you shampoo, rinse your hair and body thoroughly to remove the  shampoo.                           4.  Use CHG as you would any other liquid soap.  You can apply chg directly  to the skin and wash                       Gently with a scrungie or clean washcloth.  5.  Apply the CHG Soap to your body ONLY FROM THE NECK DOWN.   Do not use on face/ open                           Wound or open sores. Avoid contact with eyes, ears mouth and genitals (private parts).                       Wash face,  Genitals (private parts) with your normal soap.             6.  Wash thoroughly, paying special attention to the area where your surgery  will be performed.  7.  Thoroughly rinse your body with warm water from the neck down.  8.  DO NOT shower/wash with your normal soap after using and rinsing off  the CHG Soap.                9.  Pat yourself dry with a clean towel.            10.  Wear  clean pajamas.            11.  Place clean sheets on your bed the night of your first shower and do not  sleep with pets. Day of Surgery : Do not apply any lotions/deodorants the morning of surgery.  Please wear clean clothes to the hospital/surgery center.  FAILURE TO FOLLOW THESE INSTRUCTIONS MAY RESULT IN THE CANCELLATION OF YOUR SURGERY PATIENT SIGNATURE_________________________________  NURSE SIGNATURE__________________________________  ________________________________________________________________________    Adam Phenix  An incentive spirometer is a tool that can help keep your lungs clear and active. This tool measures how well you are filling your lungs with each breath. Taking long deep breaths may help reverse or decrease the chance of developing breathing (pulmonary) problems (especially infection) following:  A long period of time when you are unable to move or be active. BEFORE THE PROCEDURE   If the spirometer includes an indicator to show your best effort, your nurse or respiratory therapist will set it to a desired goal.  If possible, sit up straight or lean slightly forward. Try not to slouch.  Hold the incentive spirometer in an upright position. INSTRUCTIONS FOR USE  1. Sit on the edge of your bed if possible, or sit up as far as you can in bed or on a chair. 2. Hold the incentive spirometer in an upright position. 3. Breathe out normally. 4. Place the mouthpiece in your mouth and seal your lips tightly around it. 5. Breathe in slowly and as deeply as possible, raising the piston or the ball toward the top of the column. 6. Hold your breath for 3-5 seconds or for as  long as possible. Allow the piston or ball to fall to the bottom of the column. 7. Remove the mouthpiece from your mouth and breathe out normally. 8. Rest for a few seconds and repeat Steps 1 through 7 at least 10 times every 1-2 hours when you are awake. Take your time and take a few normal  breaths between deep breaths. 9. The spirometer may include an indicator to show your best effort. Use the indicator as a goal to work toward during each repetition. 10. After each set of 10 deep breaths, practice coughing to be sure your lungs are clear. If you have an incision (the cut made at the time of surgery), support your incision when coughing by placing a pillow or rolled up towels firmly against it. Once you are able to get out of bed, walk around indoors and cough well. You may stop using the incentive spirometer when instructed by your caregiver.  RISKS AND COMPLICATIONS  Take your time so you do not get dizzy or light-headed.  If you are in pain, you may need to take or ask for pain medication before doing incentive spirometry. It is harder to take a deep breath if you are having pain. AFTER USE  Rest and breathe slowly and easily.  It can be helpful to keep track of a log of your progress. Your caregiver can provide you with a simple table to help with this. If you are using the spirometer at home, follow these instructions: [place] IF:   You are having difficultly using the spirometer.  You have trouble using the spirometer as often as instructed.  Your pain medication is not giving enough relief while using the spirometer.  You develop fever of 100.5 F (38.1 C) or higher. SEEK IMMEDIATE MEDICAL CARE IF:   You cough up bloody sputum that had not been present before.  You develop fever of 102 F (38.9 C) or greater.  You develop worsening pain at or near the incision site. MAKE SURE YOU:   Understand these instructions.  Will watch your condition.  Will get help right away if you are not doing well or get worse. Document Released: 02/06/2007 Document Revised: 12/19/2011 Document Reviewed: 04/09/2007 [facility] Patient Information 2014 [place], [place].   WHAT IS A BLOOD TRANSFUSION? Blood Transfusion Information  A transfusion is the replacement of  blood or some of its parts. Blood is made up of multiple cells which provide different functions.  Red blood cells carry oxygen and are used for blood loss replacement.  White blood cells fight against infection.  Platelets control bleeding.  Plasma helps clot blood.  Other blood products are available for specialized needs, such as hemophilia or other clotting disorders. BEFORE THE TRANSFUSION  Who gives blood for transfusions?   Healthy volunteers who are fully evaluated to make sure their blood is safe. This is blood bank blood. Transfusion therapy is the safest it has ever been in the practice of medicine. Before blood is taken from a donor, a complete history is taken to make sure that person has no history of diseases nor engages in risky social behavior (examples are intravenous drug use or sexual activity with multiple partners). The donor's travel history is screened to minimize risk of transmitting infections, such as malaria. The donated blood is tested for signs of infectious diseases, such as HIV and hepatitis. The blood is then tested to be sure it is compatible with you in order to minimize the chance of a  transfusion reaction. If you or a relative donates blood, this is often done in anticipation of surgery and is not appropriate for emergency situations. It takes many days to process the donated blood. RISKS AND COMPLICATIONS Although transfusion therapy is very safe and saves many lives, the main dangers of transfusion include:   Getting an infectious disease.  Developing a transfusion reaction. This is an allergic reaction to something in the blood you were given. Every precaution is taken to prevent this. The decision to have a blood transfusion has been considered carefully by your caregiver before blood is given. Blood is not given unless the benefits outweigh the risks. AFTER THE TRANSFUSION  Right after receiving a blood transfusion, you will usually feel much better  and more energetic. This is especially true if your red blood cells have gotten low (anemic). The transfusion raises the level of the red blood cells which carry oxygen, and this usually causes an energy increase.  The nurse administering the transfusion will monitor you carefully for complications. HOME CARE INSTRUCTIONS  No special instructions are needed after a transfusion. You may find your energy is better. Speak with your caregiver about any limitations on activity for underlying diseases you may have. SEEK MEDICAL CARE IF:   Your condition is not improving after your transfusion.  You develop redness or irritation at the intravenous (IV) site. SEEK IMMEDIATE MEDICAL CARE IF:  Any of the following symptoms occur over the next 12 hours:  Shaking chills.  You have a temperature by mouth above 102 F (38.9 C), not controlled by medicine.  Chest, back, or muscle pain.  People around you feel you are not acting correctly or are confused.  Shortness of breath or difficulty breathing.  Dizziness and fainting.  You get a rash or develop hives.  You have a decrease in urine output.  Your urine turns a dark color or changes to pink, red, or brown. Any of the following symptoms occur over the next 10 days:  You have a temperature by mouth above 102 F (38.9 C), not controlled by medicine.  Shortness of breath.  Weakness after normal activity.  The white part of the eye turns yellow (jaundice).  You have a decrease in the amount of urine or are urinating less often.  Your urine turns a dark color or changes to pink, red, or brown. Document Released: 09/23/2000 Document Revised: 12/19/2011 Document Reviewed: 05/12/2008 [facility] Patient Information 2014 [place], [place].  _______________________________________________________________________ ________________________________________________________________________

## 2019-11-07 ENCOUNTER — Encounter (HOSPITAL_COMMUNITY)
Admission: RE | Admit: 2019-11-07 | Discharge: 2019-11-07 | Disposition: A | Discharge status: Home / Self Care | Payer: Medicare HMO | Source: Ambulatory Visit | Attending: Urology | Admitting: Urology

## 2019-11-07 ENCOUNTER — Other Ambulatory Visit (HOSPITAL_COMMUNITY)
Admission: RE | Admit: 2019-11-07 | Discharge: 2019-11-07 | Disposition: A | Discharge status: Home / Self Care | Payer: Medicare HMO | Source: Ambulatory Visit | Attending: Urology | Admitting: Urology

## 2019-11-07 ENCOUNTER — Other Ambulatory Visit: Payer: Self-pay

## 2019-11-07 ENCOUNTER — Encounter (HOSPITAL_COMMUNITY): Payer: Self-pay

## 2019-11-07 DIAGNOSIS — Z9842 Cataract extraction status, left eye: Secondary | ICD-10-CM | POA: Insufficient documentation

## 2019-11-07 DIAGNOSIS — Z79899 Other long term (current) drug therapy: Secondary | ICD-10-CM | POA: Insufficient documentation

## 2019-11-07 DIAGNOSIS — I341 Nonrheumatic mitral (valve) prolapse: Secondary | ICD-10-CM | POA: Diagnosis not present

## 2019-11-07 DIAGNOSIS — Z9841 Cataract extraction status, right eye: Secondary | ICD-10-CM | POA: Diagnosis not present

## 2019-11-07 DIAGNOSIS — Z961 Presence of intraocular lens: Secondary | ICD-10-CM | POA: Diagnosis not present

## 2019-11-07 DIAGNOSIS — Z01818 Encounter for other preprocedural examination: Secondary | ICD-10-CM | POA: Insufficient documentation

## 2019-11-07 DIAGNOSIS — I10 Essential (primary) hypertension: Secondary | ICD-10-CM | POA: Diagnosis not present

## 2019-11-07 DIAGNOSIS — C61 Malignant neoplasm of prostate: Secondary | ICD-10-CM | POA: Insufficient documentation

## 2019-11-07 DIAGNOSIS — Z20822 Contact with and (suspected) exposure to covid-19: Secondary | ICD-10-CM | POA: Diagnosis not present

## 2019-11-07 HISTORY — DX: Malignant (primary) neoplasm, unspecified: C80.1

## 2019-11-07 LAB — CBC
HCT: 43 % (ref 39.0–52.0)
Hemoglobin: 14.7 g/dL (ref 13.0–17.0)
MCH: 30.5 pg (ref 26.0–34.0)
MCHC: 34.2 g/dL (ref 30.0–36.0)
MCV: 89.2 fL (ref 80.0–100.0)
Platelets: 283 10*3/uL (ref 150–400)
RBC: 4.82 MIL/uL (ref 4.22–5.81)
RDW: 14.1 % (ref 11.5–15.5)
WBC: 3.2 10*3/uL — ABNORMAL LOW (ref 4.0–10.5)
nRBC: 0 % (ref 0.0–0.2)

## 2019-11-07 LAB — BASIC METABOLIC PANEL
Anion gap: 7 (ref 5–15)
BUN: 12 mg/dL (ref 8–23)
CO2: 27 mmol/L (ref 22–32)
Calcium: 9.3 mg/dL (ref 8.9–10.3)
Chloride: 106 mmol/L (ref 98–111)
Creatinine, Ser: 0.91 mg/dL (ref 0.61–1.24)
GFR calc Af Amer: >60 mL/min (ref >60)
GFR calc non Af Amer: >60 mL/min (ref >60)
Glucose, Bld: 93 mg/dL (ref 70–99)
Potassium: 4.5 mmol/L (ref 3.5–5.1)
Sodium: 140 mmol/L (ref 135–145)

## 2019-11-07 LAB — ABO/RH: ABO/RH(D): O POS

## 2019-11-07 LAB — SARS CORONAVIRUS 2 (TAT 6-24 HRS): SARS Coronavirus 2: NEGATIVE

## 2019-11-07 NOTE — Progress Notes (Signed)
PCP -  Cardiologist - Mihai Croitoru. LOV:   Chest x-ray -  EKG - 04/29/19 Stress Test -  ECHO -  Cardiac Cath -   Sleep Study -  CPAP -   Fasting Blood Sugar -  Checks Blood Sugar _____ times a day  Blood Thinner Instructions: Aspirin Instructions: Last Dose:  Anesthesia review:   Patient denies shortness of breath, fever, cough and chest pain at PAT appointment   Patient verbalized understanding of instructions that were given to them at the PAT appointment. Patient was also instructed that they will need to review over the PAT instructions again at home before surgery.

## 2019-11-08 NOTE — H&P (Signed)
CC: Prostate Cancer   PCP: Dr. Gaynelle Arabian   Kenneth Booker is a 69 year old gentleman who has a very strong family history of colon cancer including two brothers that died of colon cancer (age 64 and 32) and a sister with breast cancer diagnosed at age 73. He was found to have an elevated PSA of 4.32 prompting a TRUS biopsy of the prostate. 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   Family history: As above.   Imaging studies: None.   PMH: He has a history of hypertension and anxiety.  PSH: No abdominal surgeries.   TNM stage: cT1c Nx Mx  PSA: 4.32  Gleason score: 3+3=6 (Grade group 1)  Biopsy (09/13/19): 8/12 cores positive  Left: L lateral apex (40%, 3+3=6), L apex (80%, 3+3=6), L lateral mid (20%, 3+3=6), L mid (5%, 3+3=6)  Right: R apex (5%, 3+3=6), R lateral apex (10%, 3+3=6), R lateral mid (10%, 3+3=6), R base (5%, 3+3=6)  Prostate volume: 43.1 cc  PSAD: 0.10   Nomogram  OC disease: 59%  EPE: 35%  SVI: 4%  LNI: 2%  PFS (5 year, 10 year): 92%, 86%   Urinary function: IPSS is 5.  Erectile function: SHIM score is 22.     ALLERGIES: Norvasc Steroids    MEDICATIONS: Avalide     GU PSH: Prostate Needle Biopsy - 09/13/2019     NON-GU PSH: Surgical Pathology, Gross And Microscopic Examination For Prostate Needle - 09/13/2019     GU PMH: Elevated PSA - 07/24/2019    NON-GU PMH: Anxiety Hypertension    FAMILY HISTORY: Breast Cancer - Sister Colon Cancer - Brother Heart Attack - Mother Lung Cancer - Father   SOCIAL HISTORY: Marital Status: Married Preferred Language: English; Ethnicity: Not Hispanic Or Latino; Race: White Current Smoking Status: Patient does not smoke anymore. Has not smoked since 07/10/1974.   Tobacco Use Assessment Completed: Used Tobacco in last 30 days? Has never drank.  Drinks 2 caffeinated drinks per day.    REVIEW OF SYSTEMS:    GU Review Male:   Patient reports frequent  urination. Patient denies hard to postpone urination, burning/ pain with urination, get up at night to urinate, leakage of urine, stream starts and stops, trouble starting your streams, and have to strain to urinate .  Gastrointestinal (Lower):   Patient denies diarrhea and constipation.  Gastrointestinal (Upper):   Patient denies vomiting and nausea.  Constitutional:   Patient denies fever, night sweats, weight loss, and fatigue.  Skin:   Patient denies skin rash/ lesion and itching.  Eyes:   Patient denies blurred vision and double vision.  Ears/ Nose/ Throat:   Patient denies sore throat and sinus problems.  Hematologic/Lymphatic:   Patient denies swollen glands and easy bruising.  Cardiovascular:   Patient denies leg swelling and chest pains.  Respiratory:   Patient denies cough and shortness of breath.  Endocrine:   Patient denies excessive thirst.  Musculoskeletal:   Patient denies back pain and joint pain.  Neurological:   Patient denies headaches and dizziness.  Psychologic:   Patient denies depression and anxiety.   VITAL SIGNS:      Weight 172 lb / 78.02 kg  Height 66.5 in / 168.91 cm  BMI 27.3 kg/m   MULTI-SYSTEM PHYSICAL EXAMINATION:    Constitutional: Well-nourished. No physical deformities. Normally developed. Good grooming.  Neck: Neck symmetrical, not swollen. Normal tracheal position.  Respiratory: No labored breathing, no use of  accessory muscles. Clear bilaterally.  Cardiovascular: Normal temperature, normal extremity pulses, no swelling, no varicosities. Regular rate and rhythm.  Lymphatic: No enlargement of neck, axillae, groin.  Skin: No paleness, no jaundice, no cyanosis. No lesion, no ulcer, no rash.  Neurologic / Psychiatric: Oriented to time, oriented to place, oriented to person. No depression, no anxiety, no agitation.  Gastrointestinal: No mass, no tenderness, no rigidity, non obese abdomen.  Eyes: Normal conjunctivae. Normal eyelids.  Ears, Nose, Mouth,  and Throat: Left ear no scars, no lesions, no masses. Right ear no scars, no lesions, no masses. Nose no scars, no lesions, no masses. Normal hearing. Normal lips.  Musculoskeletal: Normal gait and station of head and neck.      ASSESSMENT:      ICD-10 Details  1 GU:   Prostate Cancer - C61    PLAN:      1. Low risk prostate cancer: He has elected surgical therapy and will undergo a bilateral nerve-sparing robot assisted laparoscopic radical prostatectomy.

## 2019-11-08 NOTE — Progress Notes (Signed)
Anesthesia Chart Review   Case: [id number] Date/Time: 11/11/19 1100   Procedures:      XI ROBOTIC ASSISTED LAPAROSCOPIC RADICAL PROSTATECTOMY LEVEL 1 (N/A )     LYMPHADENECTOMY, PELVIC (Bilateral )   Anesthesia type: General   Pre-op diagnosis: PROSTATE CANCER   Location: [place] [facility] / [facility]   Surgeons: Raynelle Bring, MD      DISCUSSION:69 y.o. never smoker with h/o PONV, HTN, MVP, prostate cancer scheduled for above procedure 11/11/19 with Dr. Raynelle Bring.   Last seen by cardiologist, Dr. Sanda Klein, 04/30/2019 for follow up of palpitation, HTN, MVP.  Per OV note palpitation likely MVP related premature ventricular beats, prevoius echo with only mild mitral insufficiency, no plan for further evaluation unless he develops sx.   Low risk stress test 10/31/17.   Anticipate pt can proceed with planned procedure barring acute status change.   VS: BP (!) 143/77   Pulse (!) 59   Temp 37 C (Oral)   Resp 18   Ht 5\' 6"  (1.676 m)   Wt 79.8 kg   SpO2 100%   BMI 28.41 kg/m   PROVIDERS: Gaynelle Arabian, MD is PCP   Croitoru, Dani Gobble, MD is Cardiologist  LABS: Labs reviewed: Acceptable for surgery. (all labs ordered are listed, but only abnormal results are displayed)  Labs Reviewed  CBC - Abnormal; Notable for the following components:      Result Value   WBC 3.2 (*)    All other components within normal limits  BASIC METABOLIC PANEL  TYPE AND SCREEN  ABO/RH     IMAGES:   EKG: 04/29/19 Rate 55 bpm  Sinus bradycardia   CV: Myocardial Perfusion Imaging 10/31/2017  The left ventricular ejection fraction is mildly decreased (45-54%).  Nuclear stress EF: 54%.  Blood pressure demonstrated a hypertensive response to exercise.  There was no ST segment deviation noted during stress.  The study is normal.  This is a low risk study.   Normal resting and stress perfusion. No ischemia or infarction EF 54 Past Medical History:  Diagnosis Date  . Cancer ([place])   .  Cartilage tear 2009   right knee  . PONV (postoperative nausea and vomiting)   . Systemic hypertension     Past Surgical History:  Procedure Laterality Date  . CARDIOVASCULAR STRESS TEST  02/20/2002   Negative adequate Bruce protocal exercise stress test, there is scintigraphic evidence of apical thinning with normal dynamic images EF 60% calculated by QGS  . CATARACT EXTRACTION W/PHACO Right 05/16/2013   Procedure: CATARACT EXTRACTION PHACO AND INTRAOCULAR LENS PLACEMENT (IOC);  Surgeon: Tonny Branch, MD;  Location: [facility];  Service: Ophthalmology;  Laterality: Right;  CDE:5.25  . CATARACT EXTRACTION W/PHACO Left 06/03/2013   Procedure: CATARACT EXTRACTION PHACO AND INTRAOCULAR LENS PLACEMENT (IOC);  Surgeon: Tonny Branch, MD;  Location: [facility];  Service: Ophthalmology;  Laterality: Left;  CDE: 6.26  . KNEE CARTILAGE SURGERY Right 2007?  . RENAL DUPLEX  02/20/2003   Bilateral renal arteries demonstrate normal valveswith no hemodynamically significance. Bilateral kidneysare symmetrical in shape with no obvious abnormality visualized.  . TRANSTHORACIC ECHOCARDIOGRAM  03/20/2012   EF >55%, suggestive of borderline impaired LV relaxation with normal tissue doppler, mild MR, mild TR.     MEDICATIONS: . AVALIDE 150-12.5 MG tablet   No current facility-administered medications for this encounter.     Maia Plan [facility] Pre-Surgical Testing [phone] 11/08/19  1:18 PM

## 2019-11-11 ENCOUNTER — Encounter (HOSPITAL_COMMUNITY)
Admission: RE | Disposition: A | Discharge status: Home / Self Care | Payer: Self-pay | Source: Ambulatory Visit | Attending: Urology

## 2019-11-11 ENCOUNTER — Encounter (HOSPITAL_COMMUNITY): Payer: Self-pay | Admitting: Urology

## 2019-11-11 ENCOUNTER — Other Ambulatory Visit: Payer: Self-pay

## 2019-11-11 ENCOUNTER — Ambulatory Visit (HOSPITAL_COMMUNITY): Payer: Medicare HMO | Admitting: Anesthesiology

## 2019-11-11 ENCOUNTER — Ambulatory Visit (HOSPITAL_COMMUNITY): Payer: Medicare HMO | Admitting: Physician Assistant

## 2019-11-11 ENCOUNTER — Observation Stay (HOSPITAL_COMMUNITY)
Admission: RE | Admit: 2019-11-11 | Discharge: 2019-11-12 | Disposition: A | Discharge status: Home / Self Care | Payer: Medicare HMO | Source: Ambulatory Visit | Attending: Urology | Admitting: Urology

## 2019-11-11 DIAGNOSIS — F419 Anxiety disorder, unspecified: Secondary | ICD-10-CM | POA: Diagnosis not present

## 2019-11-11 DIAGNOSIS — Z800 Family history of malignant neoplasm of digestive organs: Secondary | ICD-10-CM | POA: Diagnosis not present

## 2019-11-11 DIAGNOSIS — Z8546 Personal history of malignant neoplasm of prostate: Secondary | ICD-10-CM | POA: Diagnosis present

## 2019-11-11 DIAGNOSIS — I341 Nonrheumatic mitral (valve) prolapse: Secondary | ICD-10-CM | POA: Diagnosis not present

## 2019-11-11 DIAGNOSIS — Z87891 Personal history of nicotine dependence: Secondary | ICD-10-CM | POA: Diagnosis not present

## 2019-11-11 DIAGNOSIS — C61 Malignant neoplasm of prostate: Secondary | ICD-10-CM | POA: Diagnosis not present

## 2019-11-11 DIAGNOSIS — I10 Essential (primary) hypertension: Secondary | ICD-10-CM | POA: Insufficient documentation

## 2019-11-11 DIAGNOSIS — Z803 Family history of malignant neoplasm of breast: Secondary | ICD-10-CM | POA: Insufficient documentation

## 2019-11-11 DIAGNOSIS — R69 Illness, unspecified: Secondary | ICD-10-CM | POA: Diagnosis not present

## 2019-11-11 HISTORY — PX: ROBOT ASSISTED LAPAROSCOPIC RADICAL PROSTATECTOMY: SHX5141

## 2019-11-11 LAB — TYPE AND SCREEN
ABO/RH(D): O POS
Antibody Screen: NEGATIVE

## 2019-11-11 LAB — HEMOGLOBIN AND HEMATOCRIT, BLOOD
HCT: 39 % (ref 39.0–52.0)
Hemoglobin: 13.3 g/dL (ref 13.0–17.0)

## 2019-11-11 SURGERY — XI ROBOTIC ASSISTED LAPAROSCOPIC RADICAL PROSTATECTOMY LEVEL 1
Anesthesia: General

## 2019-11-11 MED ORDER — INDIGOTINDISULFONATE SODIUM 8 MG/ML IJ SOLN
INTRAMUSCULAR | Status: AC
  Filled 2019-11-11: qty 5

## 2019-11-11 MED ORDER — FENTANYL CITRATE (PF) 100 MCG/2ML IJ SOLN
25.0000 ug | INTRAMUSCULAR | Status: DC | PRN
  Administered 2019-11-11 (×3): 50 ug via INTRAVENOUS

## 2019-11-11 MED ORDER — LIDOCAINE 20MG/ML (2%) 15 ML SYRINGE OPTIME
INTRAMUSCULAR | Status: DC | PRN
  Administered 2019-11-11: 1.25 mg/kg/h via INTRAVENOUS

## 2019-11-11 MED ORDER — FENTANYL CITRATE (PF) 100 MCG/2ML IJ SOLN
INTRAMUSCULAR | Status: DC | PRN
  Administered 2019-11-11: 100 ug via INTRAVENOUS

## 2019-11-11 MED ORDER — KETAMINE HCL 10 MG/ML IJ SOLN
INTRAMUSCULAR | Status: DC | PRN
  Administered 2019-11-11: 40 mg via INTRAVENOUS

## 2019-11-11 MED ORDER — FENTANYL CITRATE (PF) 100 MCG/2ML IJ SOLN
INTRAMUSCULAR | Status: AC
  Filled 2019-11-11: qty 2

## 2019-11-11 MED ORDER — LIDOCAINE HCL 2 % IJ SOLN
INTRAMUSCULAR | Status: AC
  Filled 2019-11-11: qty 20

## 2019-11-11 MED ORDER — ACETAMINOPHEN 500 MG PO TABS
1000.0000 mg | ORAL_TABLET | Freq: Once | ORAL | Status: AC
  Administered 2019-11-11: 1000 mg via ORAL
  Filled 2019-11-11: qty 2

## 2019-11-11 MED ORDER — PROMETHAZINE HCL 25 MG/ML IJ SOLN: 6.2500 mg | INTRAMUSCULAR | Status: DC | PRN

## 2019-11-11 MED ORDER — ROCURONIUM BROMIDE 10 MG/ML (PF) SYRINGE
PREFILLED_SYRINGE | INTRAVENOUS | Status: DC | PRN
  Administered 2019-11-11: 100 mg via INTRAVENOUS
  Administered 2019-11-11 (×2): 20 mg via INTRAVENOUS

## 2019-11-11 MED ORDER — ROCURONIUM BROMIDE 10 MG/ML (PF) SYRINGE
PREFILLED_SYRINGE | INTRAVENOUS | Status: AC
  Filled 2019-11-11: qty 10

## 2019-11-11 MED ORDER — KETOROLAC TROMETHAMINE 15 MG/ML IJ SOLN
15.0000 mg | Freq: Four times a day (QID) | INTRAMUSCULAR | Status: DC
  Administered 2019-11-11 – 2019-11-12 (×3): 15 mg via INTRAVENOUS
  Filled 2019-11-11 (×3): qty 1

## 2019-11-11 MED ORDER — CEFAZOLIN SODIUM-DEXTROSE 1-4 GM/50ML-% IV SOLN
1.0000 g | Freq: Three times a day (TID) | INTRAVENOUS | Status: AC
  Administered 2019-11-11 – 2019-11-12 (×2): 1 g via INTRAVENOUS
  Filled 2019-11-11 (×2): qty 50

## 2019-11-11 MED ORDER — MAGNESIUM CITRATE PO SOLN
1.0000 | Freq: Once | ORAL | Status: DC
  Filled 2019-11-11: qty 296

## 2019-11-11 MED ORDER — CEFAZOLIN SODIUM-DEXTROSE 2-4 GM/100ML-% IV SOLN
2.0000 g | Freq: Once | INTRAVENOUS | Status: AC
  Administered 2019-11-11: 12:00:00 2 g via INTRAVENOUS
  Filled 2019-11-11: qty 100

## 2019-11-11 MED ORDER — SODIUM CHLORIDE 0.9 % IV BOLUS
1000.0000 mL | Freq: Once | INTRAVENOUS | Status: AC
  Administered 2019-11-11: 15:00:00 1000 mL via INTRAVENOUS

## 2019-11-11 MED ORDER — LACTATED RINGERS IR SOLN
Status: DC | PRN
  Administered 2019-11-11: 1000 mL

## 2019-11-11 MED ORDER — DIPHENHYDRAMINE HCL 50 MG/ML IJ SOLN: 12.5000 mg | Freq: Four times a day (QID) | INTRAMUSCULAR | Status: DC | PRN

## 2019-11-11 MED ORDER — KETOROLAC TROMETHAMINE 30 MG/ML IJ SOLN
INTRAMUSCULAR | Status: AC
  Filled 2019-11-11: qty 1

## 2019-11-11 MED ORDER — CELECOXIB 200 MG PO CAPS
400.0000 mg | ORAL_CAPSULE | Freq: Once | ORAL | Status: AC
  Administered 2019-11-11: 400 mg via ORAL
  Filled 2019-11-11: qty 2

## 2019-11-11 MED ORDER — KETAMINE HCL 10 MG/ML IJ SOLN
INTRAMUSCULAR | Status: AC
  Filled 2019-11-11: qty 1

## 2019-11-11 MED ORDER — BELLADONNA ALKALOIDS-OPIUM 16.2-60 MG RE SUPP
1.0000 | Freq: Four times a day (QID) | RECTAL | Status: DC | PRN
  Administered 2019-11-11: 1 via RECTAL
  Filled 2019-11-11: qty 1

## 2019-11-11 MED ORDER — DIPHENHYDRAMINE HCL 12.5 MG/5ML PO ELIX: 12.5000 mg | ORAL_SOLUTION | Freq: Four times a day (QID) | ORAL | Status: DC | PRN

## 2019-11-11 MED ORDER — PHENYLEPHRINE 40 MCG/ML (10ML) SYRINGE FOR IV PUSH (FOR BLOOD PRESSURE SUPPORT)
PREFILLED_SYRINGE | INTRAVENOUS | Status: DC | PRN
  Administered 2019-11-11: 80 ug via INTRAVENOUS

## 2019-11-11 MED ORDER — SODIUM CHLORIDE 0.9 % IR SOLN
Status: DC | PRN
  Administered 2019-11-11: 1000 mL

## 2019-11-11 MED ORDER — ZOLPIDEM TARTRATE 5 MG PO TABS: 5.0000 mg | ORAL_TABLET | Freq: Every evening | ORAL | Status: DC | PRN

## 2019-11-11 MED ORDER — BUPIVACAINE HCL (PF) 0.25 % IJ SOLN
INTRAMUSCULAR | Status: DC | PRN
  Administered 2019-11-11: 50 mL

## 2019-11-11 MED ORDER — SULFAMETHOXAZOLE-TRIMETHOPRIM 800-160 MG PO TABS: 1.0000 | ORAL_TABLET | Freq: Two times a day (BID) | ORAL | 0 refills | Status: DC

## 2019-11-11 MED ORDER — LIDOCAINE 2% (20 MG/ML) 5 ML SYRINGE
INTRAMUSCULAR | Status: DC | PRN
  Administered 2019-11-11: 100 mg via INTRAVENOUS

## 2019-11-11 MED ORDER — HEPARIN SODIUM (PORCINE) 1000 UNIT/ML IJ SOLN
INTRAMUSCULAR | Status: AC
  Filled 2019-11-11: qty 1

## 2019-11-11 MED ORDER — DEXAMETHASONE SODIUM PHOSPHATE 10 MG/ML IJ SOLN
INTRAMUSCULAR | Status: DC | PRN
  Administered 2019-11-11: 10 mg via INTRAVENOUS

## 2019-11-11 MED ORDER — KCL IN DEXTROSE-NACL 20-5-0.45 MEQ/L-%-% IV SOLN
INTRAVENOUS | Status: DC
  Filled 2019-11-11 (×3): qty 1000

## 2019-11-11 MED ORDER — PROPOFOL 10 MG/ML IV BOLUS
INTRAVENOUS | Status: DC | PRN
  Administered 2019-11-11: 150 mg via INTRAVENOUS

## 2019-11-11 MED ORDER — FLEET ENEMA 7-19 GM/118ML RE ENEM
1.0000 | ENEMA | Freq: Once | RECTAL | Status: DC
  Filled 2019-11-11: qty 1

## 2019-11-11 MED ORDER — DOCUSATE SODIUM 100 MG PO CAPS
100.0000 mg | ORAL_CAPSULE | Freq: Two times a day (BID) | ORAL | Status: DC
  Administered 2019-11-11 – 2019-11-12 (×2): 100 mg via ORAL
  Filled 2019-11-11 (×2): qty 1

## 2019-11-11 MED ORDER — LIDOCAINE 2% (20 MG/ML) 5 ML SYRINGE
INTRAMUSCULAR | Status: AC
  Filled 2019-11-11: qty 5

## 2019-11-11 MED ORDER — ONDANSETRON HCL 4 MG/2ML IJ SOLN: 4.0000 mg | INTRAMUSCULAR | Status: DC | PRN

## 2019-11-11 MED ORDER — MIDAZOLAM HCL 2 MG/2ML IJ SOLN
INTRAMUSCULAR | Status: AC
  Filled 2019-11-11: qty 2

## 2019-11-11 MED ORDER — MORPHINE SULFATE (PF) 2 MG/ML IV SOLN: 2.0000 mg | INTRAVENOUS | Status: DC | PRN

## 2019-11-11 MED ORDER — SCOPOLAMINE 1 MG/3DAYS TD PT72
1.0000 | MEDICATED_PATCH | TRANSDERMAL | Status: DC
  Administered 2019-11-11: 09:00:00 1.5 mg via TRANSDERMAL
  Filled 2019-11-11: qty 1

## 2019-11-11 MED ORDER — DEXAMETHASONE SODIUM PHOSPHATE 10 MG/ML IJ SOLN
INTRAMUSCULAR | Status: AC
  Filled 2019-11-11: qty 1

## 2019-11-11 MED ORDER — TRAMADOL HCL 50 MG PO TABS: 50.0000 mg | ORAL_TABLET | Freq: Four times a day (QID) | ORAL | 0 refills | Status: DC | PRN

## 2019-11-11 MED ORDER — ACETAMINOPHEN 325 MG PO TABS: 650.0000 mg | ORAL_TABLET | ORAL | Status: DC | PRN

## 2019-11-11 MED ORDER — BUPIVACAINE HCL 0.25 % IJ SOLN
INTRAMUSCULAR | Status: AC
  Filled 2019-11-11: qty 1

## 2019-11-11 MED ORDER — LACTATED RINGERS IV SOLN
INTRAVENOUS | Status: DC | PRN
  Administered 2019-11-11: 1 mL

## 2019-11-11 MED ORDER — EPHEDRINE SULFATE-NACL 50-0.9 MG/10ML-% IV SOSY
PREFILLED_SYRINGE | INTRAVENOUS | Status: DC | PRN
  Administered 2019-11-11: 10 mg via INTRAVENOUS

## 2019-11-11 MED ORDER — PROPOFOL 10 MG/ML IV BOLUS
INTRAVENOUS | Status: AC
  Filled 2019-11-11: qty 20

## 2019-11-11 MED ORDER — ONDANSETRON HCL 4 MG/2ML IJ SOLN
INTRAMUSCULAR | Status: AC
  Filled 2019-11-11: qty 2

## 2019-11-11 MED ORDER — BACITRACIN-NEOMYCIN-POLYMYXIN 400-5-5000 EX OINT: 1.0000 "application " | TOPICAL_OINTMENT | Freq: Three times a day (TID) | CUTANEOUS | Status: DC | PRN

## 2019-11-11 MED ORDER — LACTATED RINGERS IV SOLN: INTRAVENOUS | Status: DC

## 2019-11-11 MED ORDER — MIDAZOLAM HCL 5 MG/5ML IJ SOLN
INTRAMUSCULAR | Status: DC | PRN
  Administered 2019-11-11: 2 mg via INTRAVENOUS

## 2019-11-11 MED ORDER — ONDANSETRON HCL 4 MG/2ML IJ SOLN
INTRAMUSCULAR | Status: DC | PRN
  Administered 2019-11-11: 4 mg via INTRAVENOUS

## 2019-11-11 SURGICAL SUPPLY — 66 items
ADH SKN CLS APL DERMABOND .7 (GAUZE/BANDAGES/DRESSINGS) ×2
APL PRP STRL LF DISP 70% ISPRP (MISCELLANEOUS) ×2
APL SWBSTK 6 STRL LF DISP (MISCELLANEOUS) ×2
APPLICATOR COTTON TIP 6 STRL (MISCELLANEOUS) ×2 IMPLANT
APPLICATOR COTTON TIP 6IN STRL (MISCELLANEOUS) ×3
CATH FOLEY 2WAY SLVR 18FR 30CC (CATHETERS) ×3 IMPLANT
CATH ROBINSON RED A/P 16FR (CATHETERS) ×3 IMPLANT
CATH ROBINSON RED A/P 8FR (CATHETERS) ×3 IMPLANT
CATH TIEMANN FOLEY 18FR 5CC (CATHETERS) ×3 IMPLANT
CATH URET 5FR 28IN OPEN ENDED (CATHETERS) ×2 IMPLANT
CHLORAPREP W/TINT 26 (MISCELLANEOUS) ×3 IMPLANT
CLIP VESOLOCK LG 6/CT PURPLE (CLIP) ×6 IMPLANT
COVER SURGICAL LIGHT HANDLE (MISCELLANEOUS) ×3 IMPLANT
COVER TIP SHEARS 8 DVNC (MISCELLANEOUS) ×2 IMPLANT
COVER TIP SHEARS 8MM DA VINCI (MISCELLANEOUS) ×1
COVER WAND RF STERILE (DRAPES) IMPLANT
CUTTER ECHEON FLEX ENDO 45 340 (ENDOMECHANICALS) ×3 IMPLANT
DECANTER SPIKE VIAL GLASS SM (MISCELLANEOUS) ×3 IMPLANT
DERMABOND ADVANCED (GAUZE/BANDAGES/DRESSINGS) ×1
DERMABOND ADVANCED .7 DNX12 (GAUZE/BANDAGES/DRESSINGS) ×2 IMPLANT
DRAIN CHANNEL RND F F (WOUND CARE) IMPLANT
DRAPE ARM DVNC X/XI (DISPOSABLE) ×8 IMPLANT
DRAPE COLUMN DVNC XI (DISPOSABLE) ×2 IMPLANT
DRAPE DA VINCI XI ARM (DISPOSABLE) ×4
DRAPE DA VINCI XI COLUMN (DISPOSABLE) ×1
DRAPE SURG IRRIG POUCH 19X23 (DRAPES) ×3 IMPLANT
DRSG TEGADERM 4X4.75 (GAUZE/BANDAGES/DRESSINGS) ×3 IMPLANT
ELECT PENCIL ROCKER SW 15FT (MISCELLANEOUS) ×2 IMPLANT
ELECT REM PT RETURN 15FT ADLT (MISCELLANEOUS) ×3 IMPLANT
GAUZE SPONGE 4X4 12PLY STRL (GAUZE/BANDAGES/DRESSINGS) ×2 IMPLANT
GLOVE BIO SURGEON STRL SZ 6.5 (GLOVE) ×3 IMPLANT
GLOVE BIOGEL M STRL SZ7.5 (GLOVE) ×6 IMPLANT
GOWN STRL REUS W/TWL LRG LVL3 (GOWN DISPOSABLE) ×9 IMPLANT
HEMOSTAT SURGICEL 4X8 (HEMOSTASIS) ×2 IMPLANT
HOLDER FOLEY CATH W/STRAP (MISCELLANEOUS) ×3 IMPLANT
IRRIG SUCT STRYKERFLOW 2 WTIP (MISCELLANEOUS) ×3
IRRIGATION SUCT STRKRFLW 2 WTP (MISCELLANEOUS) ×2 IMPLANT
IV LACTATED RINGERS 1000ML (IV SOLUTION) ×3 IMPLANT
KIT TURNOVER KIT A (KITS) IMPLANT
NDL SAFETY ECLIPSE 18X1.5 (NEEDLE) ×2 IMPLANT
NEEDLE HYPO 18GX1.5 SHARP (NEEDLE) ×3
PACK ROBOT UROLOGY CUSTOM (CUSTOM PROCEDURE TRAY) ×3 IMPLANT
PENCIL SMOKE EVACUATOR (MISCELLANEOUS) IMPLANT
SEAL CANN UNIV 5-8 DVNC XI (MISCELLANEOUS) ×8 IMPLANT
SEAL XI 5MM-8MM UNIVERSAL (MISCELLANEOUS) ×4
SET TUBE SMOKE EVAC HIGH FLOW (TUBING) ×3 IMPLANT
SOLUTION ELECTROLUBE (MISCELLANEOUS) ×3 IMPLANT
STAPLE RELOAD 45 GRN (STAPLE) ×2 IMPLANT
STAPLE RELOAD 45MM GREEN (STAPLE) ×3
SUT ETHILON 3 0 PS 1 (SUTURE) ×3 IMPLANT
SUT MNCRL 3 0 RB1 (SUTURE) ×2 IMPLANT
SUT MNCRL 3 0 VIOLET RB1 (SUTURE) ×2 IMPLANT
SUT MNCRL AB 4-0 PS2 18 (SUTURE) ×6 IMPLANT
SUT MONOCRYL 3 0 RB1 (SUTURE) ×2
SUT VIC AB 0 CT1 27 (SUTURE) ×3
SUT VIC AB 0 CT1 27XBRD ANTBC (SUTURE) ×2 IMPLANT
SUT VIC AB 0 UR5 27 (SUTURE) ×3 IMPLANT
SUT VIC AB 2-0 SH 27 (SUTURE) ×3
SUT VIC AB 2-0 SH 27X BRD (SUTURE) ×2 IMPLANT
SUT VIC AB 3-0 SH 27 (SUTURE) ×3
SUT VIC AB 3-0 SH 27XBRD (SUTURE) ×1 IMPLANT
SUT VICRYL 0 UR6 27IN ABS (SUTURE) ×6 IMPLANT
SYR 27GX1/2 1ML LL SAFETY (SYRINGE) ×3 IMPLANT
TOWEL OR NON WOVEN STRL DISP B (DISPOSABLE) ×3 IMPLANT
TROCAR XCEL NON-BLD 5MMX100MML (ENDOMECHANICALS) IMPLANT
WATER STERILE IRR 1000ML POUR (IV SOLUTION) ×3 IMPLANT

## 2019-11-11 NOTE — Transfer of Care (Signed)
Immediate Anesthesia Transfer of Care Note  Patient: Kenneth Booker  Procedure(s) Performed: Procedure(s): XI ROBOTIC ASSISTED LAPAROSCOPIC RADICAL PROSTATECTOMY LEVEL 1 (N/A)  Patient Location: PACU  Anesthesia Type:General  Level of Consciousness: Alert, Awake, Oriented  Airway & Oxygen Therapy: Patient Spontanous Breathing  Post-op Assessment: Report given to RN  Post vital signs: Reviewed and stable  Last Vitals:  Vitals:   11/11/19 1432 11/11/19 1435  BP: (!) 82/49 (!) 92/57  Pulse:  79  Resp: (!) 21 (!) 22  Temp:    SpO2:  0000000    Complications: No apparent anesthesia complications

## 2019-11-11 NOTE — Anesthesia Preprocedure Evaluation (Addendum)
Anesthesia Evaluation  Patient identified by MRN, date of birth, ID band Patient awake    Reviewed: Allergy & Precautions, NPO status , Patient's Chart, lab work & pertinent test results  History of Anesthesia Complications (+) PONV and history of anesthetic complications  Airway Mallampati: I  TM Distance: >3 FB Neck ROM: Full    Dental  (+) Edentulous Upper, Edentulous Lower, Dental Advisory Given   Pulmonary neg pulmonary ROS,    Pulmonary exam normal        Cardiovascular hypertension, Normal cardiovascular exam  Study Highlights    The left ventricular ejection fraction is mildly decreased (45-54%).  Nuclear stress EF: 54%.  Blood pressure demonstrated a hypertensive response to exercise.  There was no ST segment deviation noted during stress.  The study is normal.  This is a low risk study.   Normal resting and stress perfusion. No ischemia or infarction EF 54%      Neuro/Psych negative neurological ROS     GI/Hepatic negative GI ROS, Neg liver ROS,   Endo/Other  negative endocrine ROS  Renal/GU negative Renal ROS     Musculoskeletal negative musculoskeletal ROS (+)   Abdominal   Peds  Hematology negative hematology ROS (+)   Anesthesia Other Findings Day of surgery medications reviewed with the patient.  Reproductive/Obstetrics                            Anesthesia Physical Anesthesia Plan  ASA: II  Anesthesia Plan: General   Post-op Pain Management:    Induction: Intravenous  PONV Risk Score and Plan: 4 or greater and Ondansetron, Dexamethasone, Midazolam and Scopolamine patch - Pre-op  Airway Management Planned: Oral ETT  Additional Equipment:   Intra-op Plan:   Post-operative Plan: Extubation in OR  Informed Consent:   Plan Discussed with: Anesthesiologist  Anesthesia Plan Comments:         Anesthesia Quick Evaluation

## 2019-11-11 NOTE — Anesthesia Procedure Notes (Signed)
Procedure Name: Intubation Date/Time: 11/11/2019 11:32 AM Performed by: Gerald Leitz, CRNA Pre-anesthesia Checklist: Patient identified, Patient being monitored, Timeout performed, Emergency Drugs available and Suction available Patient Re-evaluated:Patient Re-evaluated prior to induction Oxygen Delivery Method: Circle system utilized Preoxygenation: Pre-oxygenation with 100% oxygen Induction Type: IV induction Ventilation: Mask ventilation without difficulty Laryngoscope Size: Mac and 3 Grade View: Grade I Tube type: Oral Tube size: 7.5 mm Number of attempts: 1 Placement Confirmation: ETT inserted through vocal cords under direct vision,  positive ETCO2 and breath sounds checked- equal and bilateral Secured at: 22 cm Tube secured with: Tape Dental Injury: Teeth and Oropharynx as per pre-operative assessment

## 2019-11-11 NOTE — Discharge Instructions (Signed)

## 2019-11-11 NOTE — Op Note (Signed)
Preoperative diagnosis: Clinically localized adenocarcinoma of the prostate (clinical stage T1c Nx Mx)  Postoperative diagnosis: Clinically localized adenocarcinoma of the prostate (clinical stage T1c Nx Mx)  Procedure:  1. Robotic assisted laparoscopic radical prostatectomy (bilateral nerve sparing)  Surgeon: Roxy Horseman, Brooke Bonito. M.D.  Assistant: Debbrah Alar, PA-C  An assistant was required for this surgical procedure.  The duties of the assistant included but were not limited to suctioning, passing suture, camera manipulation, retraction. This procedure would not be able to be performed without an Environmental consultant.  Resident: Dr. Tharon Aquas  Anesthesia: General  Complications: None  EBL: 75 mL  IVF:  1500 mL crystalloid  Specimens: 1. Prostate and seminal vesicles 2. Bladder neck  Disposition of specimens: Pathology  Drains: 1. 20 Fr coude catheter 2. # 19 Blake pelvic drain  Indication: DEIONTRE [name] is a 69 y.o. year old patient with clinically localized prostate cancer.  After a thorough review of the management options for treatment of prostate cancer, he elected to proceed with surgical therapy and the above procedure(s).  We have discussed the potential benefits and risks of the procedure, side effects of the proposed treatment, the likelihood of the patient achieving the goals of the procedure, and any potential problems that might occur during the procedure or recuperation. Informed consent has been obtained.  Description of procedure:  The patient was taken to the operating room and a general anesthetic was administered. He was given preoperative antibiotics, placed in the dorsal lithotomy position, and prepped and draped in the usual sterile fashion. Next a preoperative timeout was performed. A urethral catheter was placed into the bladder and a site was selected near the umbilicus for placement of the camera port. This was placed using a standard open Hassan  technique which allowed entry into the peritoneal cavity under direct vision and without difficulty. An 8 mm port was placed and a pneumoperitoneum established. The camera was then used to inspect the abdomen and there was no evidence of any intra-abdominal injuries or other abnormalities. The remaining abdominal ports were then placed. 8 mm robotic ports were placed in the right lower quadrant, left lower quadrant, and far left lateral abdominal wall. A 5 mm port was placed in the right upper quadrant and a 12 mm port was placed in the right lateral abdominal wall for laparoscopic assistance. All ports were placed under direct vision without difficulty. The surgical cart was then docked.   Utilizing the cautery scissors, the bladder was reflected posteriorly allowing entry into the space of Retzius and identification of the endopelvic fascia and prostate. The periprostatic fat was then removed from the prostate allowing full exposure of the endopelvic fascia. The endopelvic fascia was then incised from the apex back to the base of the prostate bilaterally and the underlying levator muscle fibers were swept laterally off the prostate thereby isolating the dorsal venous complex. The dorsal vein was then stapled and divided with a 45 mm Flex Echelon stapler. Attention then turned to the bladder neck which was divided anteriorly thereby allowing entry into the bladder and exposure of the urethral catheter. The catheter balloon was deflated and the catheter was brought into the operative field and used to retract the prostate anteriorly. The posterior bladder neck was then examined and was divided allowing further dissection between the bladder and prostate posteriorly until the vasa deferentia and seminal vessels were identified. There was noted to be an incision in the bladder neck more posteriorly. The vasa deferentia were isolated, divided,  and lifted anteriorly. The seminal vesicles were dissected down to their  tips with care to control the seminal vascular arterial blood supply. These structures were then lifted anteriorly and the space between Denonvillier's fascia and the anterior rectum was developed with a combination of sharp and blunt dissection. This isolated the vascular pedicles of the prostate.  The lateral prostatic fascia was then sharply incised allowing release of the neurovascular bundles bilaterally. The vascular pedicles of the prostate were then ligated with Weck clips between the prostate and neurovascular bundles and divided with sharp cold scissor dissection resulting in neurovascular bundle preservation. The neurovascular bundles were then separated off the apex of the prostate and urethra bilaterally.  The urethra was then sharply transected allowing the prostate specimen to be disarticulated. The pelvis was copiously irrigated and hemostasis was ensured. There was no evidence for rectal injury.  Attention then turned to the urethral anastomosis. First, the bladder neck was repaired.  Indigo carmine was administered to identify the ureteral orifices.  A- 3-0 vicryl running two layer suture was used to repair the posterior opening.  The ureteral orifices were well away from the repair. A 2-0 Vicryl slip knot was placed between Denonvillier's fascia, the posterior bladder neck, and the posterior urethra to reapproximate these structures.  There was decreased output from the right ureteral orifice and so this was intubated with a 5 Fr ureteral catheter and was confirmed to be patent.  A double-armed 3-0 Monocryl suture was then used to perform a 360 running tension-free anastomosis between the bladder neck and urethra. A new urethral catheter was then placed into the bladder and irrigated. There were no blood clots within the bladder and the anastomosis appeared to be watertight. A #19 Blake drain was then brought through the left lateral 8 mm port site and positioned appropriately within the  pelvis. It was secured to the skin with a nylon suture. The surgical cart was then undocked. The right lateral 12 mm port site was closed at the fascial level with a 0 Vicryl suture placed laparoscopically. All remaining ports were then removed under direct vision. The prostate specimen was removed intact within the Endopouch retrieval bag via the periumbilical camera port site. This fascial opening was closed with two running 0 Vicryl sutures. 0.25% Marcaine was then injected into all port sites and all incisions were reapproximated at the skin level with 4-0 Monocryl subcuticular sutures and liquid skin adhesive. The patient appeared to tolerate the procedure well and without complications. The patient was able to be extubated and transferred to the recovery unit in satisfactory condition.  Pryor Curia MD

## 2019-11-11 NOTE — Anesthesia Postprocedure Evaluation (Signed)
Anesthesia Post Note  Patient: Kenneth Booker  Procedure(s) Performed: XI ROBOTIC ASSISTED LAPAROSCOPIC RADICAL PROSTATECTOMY LEVEL 1 (N/A )     Patient location during evaluation: PACU Anesthesia Type: General Level of consciousness: awake and alert Pain management: pain level controlled Vital Signs Assessment: post-procedure vital signs reviewed and stable Respiratory status: spontaneous breathing, nonlabored ventilation and respiratory function stable Cardiovascular status: blood pressure returned to baseline and stable Postop Assessment: no apparent nausea or vomiting Anesthetic complications: no    Last Vitals:  Vitals:   11/11/19 1530 11/11/19 1553  BP: (!) 142/87 (!) 141/84  Pulse: 78 74  Resp: 12 20  Temp: (!) 36.3 C (!) 36.3 C  SpO2: 95% 97%    Last Pain:  Vitals:   11/11/19 1815  TempSrc:   PainSc: [place]

## 2019-11-12 DIAGNOSIS — C61 Malignant neoplasm of prostate: Secondary | ICD-10-CM | POA: Diagnosis not present

## 2019-11-12 LAB — BASIC METABOLIC PANEL
Anion gap: 7 (ref 5–15)
BUN: 23 mg/dL (ref 8–23)
CO2: 23 mmol/L (ref 22–32)
Calcium: 8.4 mg/dL — ABNORMAL LOW (ref 8.9–10.3)
Chloride: 105 mmol/L (ref 98–111)
Creatinine, Ser: 1.38 mg/dL — ABNORMAL HIGH (ref 0.61–1.24)
GFR calc Af Amer: >60 mL/min (ref >60)
GFR calc non Af Amer: 52 mL/min — ABNORMAL LOW (ref >60)
Glucose, Bld: 161 mg/dL — ABNORMAL HIGH (ref 70–99)
Potassium: 4.6 mmol/L (ref 3.5–5.1)
Sodium: 135 mmol/L (ref 135–145)

## 2019-11-12 LAB — HEMOGLOBIN AND HEMATOCRIT, BLOOD
HCT: 35.5 % — ABNORMAL LOW (ref 39.0–52.0)
Hemoglobin: 12.4 g/dL — ABNORMAL LOW (ref 13.0–17.0)

## 2019-11-12 MED ORDER — PHENOL 1.4 % MT LIQD
1.0000 | OROMUCOSAL | Status: DC | PRN
  Filled 2019-11-12: qty 177

## 2019-11-12 MED ORDER — BISACODYL 10 MG RE SUPP
10.0000 mg | Freq: Once | RECTAL | Status: AC
  Administered 2019-11-12: 10 mg via RECTAL
  Filled 2019-11-12: qty 1

## 2019-11-12 MED ORDER — SODIUM CHLORIDE 0.9 % IV BOLUS
500.0000 mL | Freq: Once | INTRAVENOUS | Status: AC
  Administered 2019-11-12: 500 mL via INTRAVENOUS

## 2019-11-12 MED ORDER — TRAMADOL HCL 50 MG PO TABS: 50.0000 mg | ORAL_TABLET | Freq: Four times a day (QID) | ORAL | Status: DC | PRN

## 2019-11-12 NOTE — Progress Notes (Signed)
Urology Progress Note   1 Day Post-Op s/p RALP  Subjective: Borderline low UOP overnight that picked up over the last 4 hrs. Pain controlled. Ambulated x1. Af, VSS. Slight bump in Cr 1.3 form 0.9. Hgb stable.  Objective: Vital signs in last 24 hours: Temp:  [97.4 F (36.3 C)-98.6 F (37 C)] 98.3 F (36.8 C) (02/02 0432) Pulse Rate:  [60-101] 84 (02/02 0432) Resp:  [12-22] 16 (02/02 0432) BP: (82-160)/(49-96) 136/81 (02/02 0432) SpO2:  [92 %-99 %] 94 % (02/02 0432) Weight:  [80.5 kg] 80.5 kg (02/01 1553)  Intake/Output from previous day: 02/01 0701 - 02/02 0700 In: 4300.9 [P.O.:720; I.V.:2480.9; IV Piggyback:1050] Out: 605 [Urine:475; Drains:60; Blood:70] Intake/Output this shift: No intake/output data recorded.  Physical Exam:  General Appearance:  No acute distress. Alert and oriented x 3. Pulmonary: Normal respiratory effort on room air Cardiovascular: Regular rate Abdomen: Soft, non-tender, without masses. Incision c/d/i w/ dermabond. JP SS.  Musculoskeletal:  Extremities without edema. GU: No CVA or SP tenderness. Foley clear yellow  Neurologic:  No motor abnormalities noted.    Lab Results: Recent Labs    11/11/19 1439 11/12/19 0421  HGB 13.3 12.4*  HCT 39.0 35.5*   BMET Recent Labs    11/12/19 0421  NA 135  K 4.6  CL 105  CO2 23  GLUCOSE 161*  BUN 23  CREATININE 1.38*  CALCIUM 8.4*     Studies/Results: No results found.  Assessment/Plan:  69 y.o. male s/p RALP. Doing well post op, likely dehydration as cause for low uop and cr bump.    - Push PO fluids - d/c toradol - Remove JP - Suppository - Foley teaching - Ambulate - Likely home in PM  Dispo: floor   LOS: 0 days   Tharon Aquas 11/12/2019, 8:41 AM

## 2019-11-12 NOTE — Discharge Summary (Signed)
[facility] Discharge Summary  Admit date: 11/11/2019  Discharge date and time: 11/12/19   Discharge to: Home  Discharge Service: Urology  Discharge Attending Physician:  Alinda Money  Discharge  Diagnoses: Prostate Cancer  OR Procedures: Procedure(s): XI ROBOTIC ASSISTED LAPAROSCOPIC RADICAL PROSTATECTOMY LEVEL 1 11/11/2019   Ancillary Procedures: None   Discharge Day Services: The patient was seen and examined by the Urology team both in the morning and immediately prior to discharge.  Vital signs and laboratory values were stable and within normal limits.  The physical exam was benign and unchanged and all surgical wounds were examined.  Discharge instructions were explained and all questions answered.  Subjective  Doing well this afternoon.  Has ambulated and is tolerating p.o. without nausea or vomiting.  JP removed.  Pain controlled.  Objective Patient Vitals for the past 8 hrs:  SpO2  11/12/19 1122 90 %   Total I/O In: -  Out: 245 [Urine:225; Drains:20]  General Appearance:        No acute distress Lungs:                       Normal work of breathing on room air Heart:                                Regular rate and rhythm Abdomen:                         Soft, non-tender, non-distended.  Abdominal incisions clean dry and intact.  JP removed. GU:        Foley with clear yellow urine Extremities:                      Warm and well perfused   Hospital Course:   69 year old male with history of prostate cancer.  He underwent radical robotic prostatectomy WITHOUT lymphadenectomy on 11/11/2019.  Intraoperatively the ureteral orifices were close to the anastomosis but were both inspected and found to be patent prior to completion of the anastomosis.  The patient tolerated the procedure well, was extubated in the OR, and afterwards was taken to the PACU for routine post-surgical care. When stable the patient was transferred to the floor.     The patient did well  postoperatively.  They ambulated multiple times.  JP was low output was removed.  Tolerated p.o. intake.  He was taught how to care for his Foley catheter.    He will follow up with next week to review pathology and catheter removal.  Prescribed Bactrim and pain medication.  Condition at Discharge: Improved  Discharge Medications:  Allergies as of 11/12/2019      Reactions   Norvasc [amlodipine Besylate] Anxiety   Tarka [trandolapril-verapamil Hcl Er] Anxiety      Medication List    TAKE these medications   Avalide 150-12.5 MG tablet Generic drug: irbesartan-hydrochlorothiazide Take 1 tablet by mouth daily.   sulfamethoxazole-trimethoprim 800-160 MG tablet Commonly known as: BACTRIM DS Take 1 tablet by mouth 2 (two) times daily. Start the day prior to foley removal appointment   traMADol 50 MG tablet Commonly known as: Ultram Take 1-2 tablets (50-100 mg total) by mouth every 6 (six) hours as needed for moderate pain or severe pain.

## 2019-11-15 LAB — SURGICAL PATHOLOGY

## 2019-12-06 DIAGNOSIS — M62838 Other muscle spasm: Secondary | ICD-10-CM | POA: Diagnosis not present

## 2019-12-06 DIAGNOSIS — M6281 Muscle weakness (generalized): Secondary | ICD-10-CM | POA: Diagnosis not present

## 2019-12-06 DIAGNOSIS — N393 Stress incontinence (female) (male): Secondary | ICD-10-CM | POA: Diagnosis not present

## 2019-12-20 DIAGNOSIS — M6281 Muscle weakness (generalized): Secondary | ICD-10-CM | POA: Diagnosis not present

## 2019-12-20 DIAGNOSIS — M62838 Other muscle spasm: Secondary | ICD-10-CM | POA: Diagnosis not present

## 2019-12-20 DIAGNOSIS — N393 Stress incontinence (female) (male): Secondary | ICD-10-CM | POA: Diagnosis not present

## 2020-02-14 DIAGNOSIS — C61 Malignant neoplasm of prostate: Secondary | ICD-10-CM | POA: Diagnosis not present

## 2020-02-19 DIAGNOSIS — C61 Malignant neoplasm of prostate: Secondary | ICD-10-CM | POA: Diagnosis not present

## 2020-02-19 DIAGNOSIS — N5231 Erectile dysfunction following radical prostatectomy: Secondary | ICD-10-CM | POA: Diagnosis not present

## 2020-02-19 DIAGNOSIS — N393 Stress incontinence (female) (male): Secondary | ICD-10-CM | POA: Diagnosis not present

## 2020-03-26 DIAGNOSIS — Z86010 Personal history of colonic polyps: Secondary | ICD-10-CM | POA: Diagnosis not present

## 2020-03-26 DIAGNOSIS — Z1211 Encounter for screening for malignant neoplasm of colon: Secondary | ICD-10-CM | POA: Diagnosis not present

## 2020-03-26 DIAGNOSIS — K648 Other hemorrhoids: Secondary | ICD-10-CM | POA: Diagnosis not present

## 2020-03-26 LAB — HM COLONOSCOPY

## 2020-04-15 DIAGNOSIS — I10 Essential (primary) hypertension: Secondary | ICD-10-CM | POA: Diagnosis not present

## 2020-04-15 DIAGNOSIS — Z0189 Encounter for other specified special examinations: Secondary | ICD-10-CM | POA: Diagnosis not present

## 2020-04-15 DIAGNOSIS — Z8546 Personal history of malignant neoplasm of prostate: Secondary | ICD-10-CM | POA: Diagnosis not present

## 2020-04-15 DIAGNOSIS — R002 Palpitations: Secondary | ICD-10-CM | POA: Diagnosis not present

## 2020-04-29 ENCOUNTER — Ambulatory Visit: Payer: Medicare HMO | Admitting: Cardiovascular Disease

## 2020-04-29 DIAGNOSIS — E039 Hypothyroidism, unspecified: Secondary | ICD-10-CM | POA: Diagnosis not present

## 2020-04-29 DIAGNOSIS — R7301 Impaired fasting glucose: Secondary | ICD-10-CM | POA: Diagnosis not present

## 2020-04-29 DIAGNOSIS — Z0000 Encounter for general adult medical examination without abnormal findings: Secondary | ICD-10-CM | POA: Diagnosis not present

## 2020-04-30 ENCOUNTER — Ambulatory Visit (INDEPENDENT_AMBULATORY_CARE_PROVIDER_SITE_OTHER): Payer: Medicare HMO | Admitting: Cardiovascular Disease

## 2020-04-30 ENCOUNTER — Encounter: Payer: Self-pay | Admitting: Cardiovascular Disease

## 2020-04-30 ENCOUNTER — Other Ambulatory Visit: Payer: Self-pay

## 2020-04-30 VITALS — BP 133/69 | HR 62 | Ht 66.0 in | Wt 170.2 lb

## 2020-04-30 DIAGNOSIS — R002 Palpitations: Secondary | ICD-10-CM

## 2020-04-30 DIAGNOSIS — R7989 Other specified abnormal findings of blood chemistry: Secondary | ICD-10-CM

## 2020-04-30 DIAGNOSIS — I341 Nonrheumatic mitral (valve) prolapse: Secondary | ICD-10-CM

## 2020-04-30 DIAGNOSIS — R799 Abnormal finding of blood chemistry, unspecified: Secondary | ICD-10-CM | POA: Diagnosis not present

## 2020-04-30 DIAGNOSIS — I10 Essential (primary) hypertension: Secondary | ICD-10-CM

## 2020-04-30 NOTE — Patient Instructions (Signed)

## 2020-04-30 NOTE — Progress Notes (Signed)
Patient ID: Kenneth Booker, male   DOB: 12/17/1950, 69 y.o.   MRN: [id number]    Cardiology Office Note    Date:  04/30/2020   ID:  Kenneth Booker, DOB 05-27-1951, MRN [id number]  PCP:  Celene Squibb, MD  Cardiologist:   Sanda Klein, MD   Chief Complaint  Patient presents with   Palpitations   Mitral Valve Prolapse    History of Present Illness:  Kenneth Booker is a 68 y.o. male with hypertension and mild mitral valve prolapse and palpitations.   The major change in his health over the last year has been the diagnosis of prostate cancer, for which she underwent radical prostatectomy with Dr. Alinda Money.  He is recovering well from this, with no evidence of disease and with only mild side effects.  He has occasional palpitations which do not bother him much and are not associated with dizziness or syncope.  He has not noticed any change in his overall stamina or breathing.  He remains physically active.  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   The patient specifically denies any chest pain at rest exertion, dyspnea at rest or with exertion, orthopnea, paroxysmal nocturnal dyspnea, syncope, focal neurological deficits, intermittent claudication, lower extremity edema, unexplained weight gain, cough, hemoptysis or wheezing.    Past Medical History:  Diagnosis Date   Cancer [facility])    Cartilage tear 2009   right knee   PONV (postoperative nausea and vomiting)    Systemic hypertension     Past Surgical History:  Procedure Laterality Date   CARDIOVASCULAR STRESS TEST  02/20/2002   Negative adequate Bruce protocal exercise stress test, there is scintigraphic evidence of apical thinning with normal dynamic images EF 60% calculated by QGS   CATARACT EXTRACTION W/PHACO Right 05/16/2013   Procedure: CATARACT EXTRACTION PHACO AND INTRAOCULAR LENS PLACEMENT ([place]);  Surgeon: Tonny Branch, MD;  Location: [facility];  Service:  Ophthalmology;  Laterality: Right;  CDE:5.25   CATARACT EXTRACTION W/PHACO Left 06/03/2013   Procedure: CATARACT EXTRACTION PHACO AND INTRAOCULAR LENS PLACEMENT ([facility]);  Surgeon: Tonny Branch, MD;  Location: [facility];  Service: Ophthalmology;  Laterality: Left;  CDE: 6.26   KNEE CARTILAGE SURGERY Right 2007?   RENAL DUPLEX  02/20/2003   Bilateral renal arteries demonstrate normal valveswith no hemodynamically significance. Bilateral kidneysare symmetrical in shape with no obvious abnormality visualized.   ROBOT ASSISTED LAPAROSCOPIC RADICAL PROSTATECTOMY N/A 11/11/2019   Procedure: XI ROBOTIC ASSISTED LAPAROSCOPIC RADICAL PROSTATECTOMY LEVEL 1;  Surgeon: Raynelle Bring, MD;  Location: [facility];  Service: Urology;  Laterality: N/A;   TRANSTHORACIC ECHOCARDIOGRAM  03/20/2012   EF >55%, suggestive of borderline impaired LV relaxation with normal tissue doppler, mild MR, mild TR.     Outpatient Medications Prior to Visit  Medication Sig Dispense Refill   AVALIDE 150-12.5 MG tablet Take 1 tablet by mouth daily.   1   sulfamethoxazole-trimethoprim (BACTRIM DS) 800-160 MG tablet Take 1 tablet by mouth 2 (two) times daily. Start the day prior to foley removal appointment 6 tablet 0   traMADol (ULTRAM) 50 MG tablet Take 1-2 tablets (50-100 mg total) by mouth every 6 (six) hours as needed for moderate pain or severe pain. 20 tablet 0   No facility-administered medications prior to visit.     Allergies:   Norvasc [amlodipine besylate] and Tarka [trandolapril-verapamil hcl er]   Social History   Socioeconomic History   Marital status: Married  Spouse name: Not on file   Number of children: Not on file   Years of education: Not on file   Highest education level: Not on file  Occupational History   Not on file  Tobacco Use   Smoking status: Never Smoker   Smokeless tobacco: Never Used  Vaping Use   Vaping Use: Never used  Substance and Sexual Activity   Alcohol use: No   Drug use:  No   Sexual activity: Not on file  Other Topics Concern   Not on file  Social History Narrative   Not on file   Social Determinants of Health   Financial Resource Strain:    Difficulty of Paying Living Expenses:   Food Insecurity:    Worried About Charity fundraiser in the Last Year:    Arboriculturist in the Last Year:   Transportation Needs:    Film/video editor (Medical):    Lack of Transportation (Non-Medical):   Physical Activity:    Days of Exercise per Week:    Minutes of Exercise per Session:   Stress:    Feeling of Stress :   Social Connections:    Frequency of Communication with Friends and Family:    Frequency of Social Gatherings with Friends and Family:    Attends Religious Services:    Active Member of Clubs or Organizations:    Attends Music therapist:    Marital Status:      Family History:  The patient's family history includes Cancer in his brother, brother, and father; Cancer (age of onset: 64) in his sister; Diabetes in his paternal grandmother; Heart attack in his mother; Hypertension in his sister and sister.   ROS:   Please see the history of present illness.    ROS  All other systems are reviewed and are negative.  PHYSICAL EXAM:   VS:  BP 133/69    Pulse 62    Ht 5\' 6"  (1.676 m)    Wt 170 lb 3.2 oz (77.2 kg)    SpO2 99%    BMI 27.47 kg/m     midsystolic click becomes much more obvious after the Valsalva maneuver, but there is no murmur even with provocative maneuvers, no diastolic murmurs, rubs or gallops  General: Alert, oriented x3, no distress, looks fit, younger than his stated age Head: no evidence of trauma, PERRL, EOMI, no exophtalmos or lid lag, no myxedema, no xanthelasma; normal ears, nose and oropharynx Neck: normal jugular venous pulsations and no hepatojugular reflux; brisk carotid pulses without delay and no carotid bruits Chest: clear to auscultation, no signs of consolidation by percussion or  palpation, normal fremitus, symmetrical and full respiratory excursions Cardiovascular: normal position and quality of the apical impulse, regular rhythm, normal first and second heart sounds, no diastolic murmurs, rubs or gallops.  He has a distinct midsystolic click that becomes more obvious with the Valsalva maneuver but does not associate her murmur either at rest or with provocation Abdomen: no tenderness or distention, no masses by palpation, no abnormal pulsatility or arterial bruits, normal bowel sounds, no hepatosplenomegaly Extremities: no clubbing, cyanosis or edema; 2+ radial, ulnar and brachial pulses bilaterally; 2+ right femoral, posterior tibial and dorsalis pedis pulses; 2+ left femoral, posterior tibial and dorsalis pedis pulses; no subclavian or femoral bruits Neurological: grossly nonfocal Psych: Normal mood and affect    Wt Readings from Last 3 Encounters:  04/30/20 170 lb 3.2 oz (77.2 kg)  11/11/19 177 lb 7.5  oz (80.5 kg)  11/07/19 176 lb (79.8 kg)      Studies/Labs Reviewed:   EKG:  EKG is ordered today.  It shows mild sinus bradycardia but is otherwise a completely normal tracing  Lipid Panel     Component Value Date/Time   CHOL 131 10/26/2017 0853   TRIG 68 10/26/2017 0853   HDL 48 10/26/2017 0853   CHOLHDL 2.7 10/26/2017 0853   LDLCALC 69 10/26/2017 0853   BMET    Component Value Date/Time   NA 135 11/12/2019 0421   NA 144 10/26/2017 0853   K 4.6 11/12/2019 0421   CL 105 11/12/2019 0421   CO2 23 11/12/2019 0421   GLUCOSE 161 (H) 11/12/2019 0421   BUN 23 11/12/2019 0421   BUN 10 10/26/2017 0853   CREATININE 1.38 (H) 11/12/2019 0421   CALCIUM 8.4 (L) 11/12/2019 0421   GFRNONAA 52 (L) 11/12/2019 0421   GFRAA >60 11/12/2019 0421   04/19/2019 potassium 4.2, creatinine 0.9  ASSESSMENT:    1. MVP (mitral valve prolapse)   2. Essential hypertension   3. Heart palpitations   4. Abnormal serum creatinine level      PLAN:  In order of problems  listed above:  1. Palpitations: Probably MVP related PVCs.  They are not particularly bothersome and he should not receive beta-blockers due to his underlying bradycardia. 2. HTN: Well-controlled.  Note that his creatinine was substantially higher this year at 1.39 rather than previous baseline 0.9.  This was around the time of his prostate surgery. 3. MVP: Distinct midsystolic click but no murmur and no symptoms of mitral insufficiency.  Asked him to report any increase in palpitation frequency or development of shortness of breath with activity. 4. Elev creatinine: Not sure if this was a one-time abnormality.  Will need to be repeated.  Medication Adjustments/Labs and Tests Ordered: Current medicines are reviewed at length with the patient today.  Concerns regarding medicines are outlined above.  Medication changes, Labs and Tests ordered today are listed in the Patient Instructions below. Patient Instructions  Medication Instructions:  No changes *If you need a refill on your cardiac medications before your next appointment, please call your pharmacy*   Lab Work: None ordered If you have labs (blood work) drawn today and your tests are completely normal, you will receive your results only by:  [place] (if you have MyChart) OR  A paper copy in the mail If you have any lab test that is abnormal or we need to change your treatment, we will call you to review the results.   Testing/Procedures: None ordered   Follow-Up: At [facility], you and your health needs are our priority.  As part of our continuing mission to provide you with exceptional heart care, we have created designated Provider Care Teams.  These Care Teams include your primary Cardiologist (physician) and Advanced Practice Providers (APPs -  Physician Assistants and Nurse Practitioners) who all work together to provide you with the care you need, when you need it.  We recommend signing up for the patient portal  called "MyChart".  Sign up information is provided on this After Visit Summary.  MyChart is used to connect with patients for Virtual Visits (Telemedicine).  Patients are able to view lab/test results, encounter notes, upcoming appointments, etc.  Non-urgent messages can be sent to your provider as well.   To learn more about what you can do with MyChart, go to [email or web address].    Your next appointment:  12 month(s)  The format for your next appointment:   In Person  Provider:   You may see Sanda Klein, MD or one of the following Advanced Practice Providers on your designated Care Team:    Almyra Deforest, PA-C  Fabian Sharp, [place] or   Roby Lofts, PA-C         Signed, Sanda Klein, MD  04/30/2020 9:03 AM    [facility] [address], [place], [place]  [address] Phone: [phone]; Fax: [phone]

## 2020-04-30 NOTE — Addendum Note (Signed)
Addended by: Rexanne Mano B on: 04/30/2020 04:55 PM   Modules accepted: Orders

## 2020-05-11 DIAGNOSIS — Z8546 Personal history of malignant neoplasm of prostate: Secondary | ICD-10-CM | POA: Diagnosis not present

## 2020-05-11 DIAGNOSIS — Z0001 Encounter for general adult medical examination with abnormal findings: Secondary | ICD-10-CM | POA: Diagnosis not present

## 2020-05-11 DIAGNOSIS — R002 Palpitations: Secondary | ICD-10-CM | POA: Diagnosis not present

## 2020-05-11 DIAGNOSIS — I10 Essential (primary) hypertension: Secondary | ICD-10-CM | POA: Diagnosis not present

## 2020-07-06 ENCOUNTER — Telehealth: Payer: Self-pay | Admitting: Cardiovascular Disease

## 2020-07-06 NOTE — Telephone Encounter (Signed)
*  STAT* If patient is at the pharmacy, call can be transferred to refill team.   1. Which medications need to be refilled? (please list name of each medication and dose if known) AVALIDE 150-12.5 MG tablet  2. Which pharmacy/location (including street and city if local pharmacy) is medication to be sent to? [id number] - [place], [place] [address]  3. Do they need a 30 day or 90 day supply? 90 day

## 2020-07-07 ENCOUNTER — Other Ambulatory Visit: Payer: Self-pay

## 2020-07-09 ENCOUNTER — Other Ambulatory Visit: Payer: Self-pay | Admitting: Cardiovascular Disease

## 2020-08-28 DIAGNOSIS — T22232A Burn of second degree of left upper arm, initial encounter: Secondary | ICD-10-CM | POA: Diagnosis not present

## 2020-09-07 DIAGNOSIS — C61 Malignant neoplasm of prostate: Secondary | ICD-10-CM | POA: Diagnosis not present

## 2020-09-11 DIAGNOSIS — Z809 Family history of malignant neoplasm, unspecified: Secondary | ICD-10-CM | POA: Diagnosis not present

## 2020-09-11 DIAGNOSIS — R002 Palpitations: Secondary | ICD-10-CM | POA: Diagnosis not present

## 2020-09-11 DIAGNOSIS — I341 Nonrheumatic mitral (valve) prolapse: Secondary | ICD-10-CM | POA: Diagnosis not present

## 2020-09-11 DIAGNOSIS — Z8249 Family history of ischemic heart disease and other diseases of the circulatory system: Secondary | ICD-10-CM | POA: Diagnosis not present

## 2020-09-11 DIAGNOSIS — I10 Essential (primary) hypertension: Secondary | ICD-10-CM | POA: Diagnosis not present

## 2020-09-16 DIAGNOSIS — N393 Stress incontinence (female) (male): Secondary | ICD-10-CM | POA: Diagnosis not present

## 2020-09-16 DIAGNOSIS — C61 Malignant neoplasm of prostate: Secondary | ICD-10-CM | POA: Diagnosis not present

## 2020-09-16 DIAGNOSIS — N5231 Erectile dysfunction following radical prostatectomy: Secondary | ICD-10-CM | POA: Diagnosis not present

## 2020-12-25 DIAGNOSIS — M25562 Pain in left knee: Secondary | ICD-10-CM | POA: Diagnosis not present

## 2021-01-26 ENCOUNTER — Telehealth: Payer: Self-pay | Admitting: Cardiovascular Disease

## 2021-01-26 NOTE — Telephone Encounter (Signed)
   [facility] Pre-operative Risk Assessment    Patient Name: Kenneth Booker  DOB: 24-Dec-1950  MRN: [id number]   Request for surgical clearance:  1. What type of surgery is being performed? Left total knee replacement   2. When is this surgery scheduled? TBD   3. What type of clearance is required (medical clearance vs. Pharmacy clearance to hold med vs. Both)? Medical   4. Are there any medications that need to be held prior to surgery and how long? None specified    5. Practice name and name of physician performing surgery? Dr. Edmonia Lynch with [facility]   6. What is the office phone number? [phone]   7.   What is the office fax number? [phone] (attn: Sherri)  8.   Anesthesia type (None, local, MAC, general) ? Not specified    Fidel Levy 01/26/2021, 12:12 PM  _________________________________________________________________   (provider comments below)

## 2021-01-27 NOTE — Telephone Encounter (Signed)
   Name: Kenneth Booker  DOB: May 20, 1951  MRN: [id number]  Primary Cardiologist: Sanda Klein, MD  Chart reviewed as part of pre-operative protocol coverage. Because of [name] past medical history and time since last visit, he will require a follow-up visit in order to better assess preoperative cardiovascular risk.  Pre-op covering staff: - Please schedule appointment and call patient to inform them. If patient already had an upcoming appointment within acceptable timeframe, please add "pre-op clearance" to the appointment notes so provider is aware. - Please contact requesting surgeon's office via preferred method (i.e, phone, fax) to inform them of need for appointment prior to surgery.  If applicable, this message will also be routed to pharmacy pool and/or primary cardiologist for input on holding anticoagulant/antiplatelet agent as requested below so that this information is available to the clearing provider at time of patient's appointment.   [place], [place]  01/27/2021, 3:03 PM

## 2021-01-27 NOTE — Telephone Encounter (Signed)
S/w pt's wife who states the pt is not home right now. She also tells me the pt is not having knee surgery now that he got a cortisone shot. I asked if the pt could call the office and ask for me Arbie Cookey) so that I may confirm details if surgery clearance is still needed.

## 2021-01-28 NOTE — Telephone Encounter (Signed)
   Name: Kenneth Booker  DOB: November 22, 1950  MRN: [id number]   Primary Cardiologist: Sanda Klein, MD  Chart reviewed as part of pre-operative protocol coverage. Patient was contacted 01/28/2021 in reference to pre-operative risk assessment for pending surgery as outlined below.  ESAIAS CLEAVENGER was last seen on 04/30/2020 by Dr. Sallyanne Kuster.  Since that day, NORRIS BRUMBACH has done well without chest pain or worsening dyspnea.  Therefore, based on ACC/AHA guidelines, the patient would be at acceptable risk for the planned procedure without further cardiovascular testing.   The patient was advised that if he develops new symptoms prior to surgery to contact our office to arrange for a follow-up visit, and he verbalized understanding.  I will route this recommendation to the requesting party via Epic fax function and remove from pre-op pool. Please call with questions.  [place], [place] 01/28/2021, 8:34 AM

## 2021-01-28 NOTE — Telephone Encounter (Signed)
If the issue does come up again, he is at low risk for cardiovascular complications with planned knee surgery and no particular repeat testing or precautions are needed.

## 2021-01-28 NOTE — Telephone Encounter (Signed)
Will forward to pre op provider to review notes from MD

## 2021-02-17 DIAGNOSIS — D1801 Hemangioma of skin and subcutaneous tissue: Secondary | ICD-10-CM | POA: Diagnosis not present

## 2021-02-17 DIAGNOSIS — L570 Actinic keratosis: Secondary | ICD-10-CM | POA: Diagnosis not present

## 2021-02-17 DIAGNOSIS — L578 Other skin changes due to chronic exposure to nonionizing radiation: Secondary | ICD-10-CM | POA: Diagnosis not present

## 2021-03-10 DIAGNOSIS — H524 Presbyopia: Secondary | ICD-10-CM | POA: Diagnosis not present

## 2021-04-14 DIAGNOSIS — C61 Malignant neoplasm of prostate: Secondary | ICD-10-CM | POA: Diagnosis not present

## 2021-04-15 DIAGNOSIS — Z8546 Personal history of malignant neoplasm of prostate: Secondary | ICD-10-CM | POA: Diagnosis not present

## 2021-04-15 DIAGNOSIS — I10 Essential (primary) hypertension: Secondary | ICD-10-CM | POA: Diagnosis not present

## 2021-04-15 DIAGNOSIS — Z125 Encounter for screening for malignant neoplasm of prostate: Secondary | ICD-10-CM | POA: Diagnosis not present

## 2021-04-15 DIAGNOSIS — I341 Nonrheumatic mitral (valve) prolapse: Secondary | ICD-10-CM | POA: Diagnosis not present

## 2021-04-15 DIAGNOSIS — E785 Hyperlipidemia, unspecified: Secondary | ICD-10-CM | POA: Diagnosis not present

## 2021-04-19 DIAGNOSIS — I10 Essential (primary) hypertension: Secondary | ICD-10-CM | POA: Diagnosis not present

## 2021-04-19 DIAGNOSIS — E785 Hyperlipidemia, unspecified: Secondary | ICD-10-CM | POA: Diagnosis not present

## 2021-04-19 DIAGNOSIS — Z8546 Personal history of malignant neoplasm of prostate: Secondary | ICD-10-CM | POA: Diagnosis not present

## 2021-04-19 DIAGNOSIS — D692 Other nonthrombocytopenic purpura: Secondary | ICD-10-CM | POA: Diagnosis not present

## 2021-04-19 DIAGNOSIS — D72819 Decreased white blood cell count, unspecified: Secondary | ICD-10-CM | POA: Diagnosis not present

## 2021-04-19 DIAGNOSIS — Z1331 Encounter for screening for depression: Secondary | ICD-10-CM | POA: Diagnosis not present

## 2021-04-19 DIAGNOSIS — Z0000 Encounter for general adult medical examination without abnormal findings: Secondary | ICD-10-CM | POA: Diagnosis not present

## 2021-04-19 DIAGNOSIS — Z8249 Family history of ischemic heart disease and other diseases of the circulatory system: Secondary | ICD-10-CM | POA: Diagnosis not present

## 2021-04-19 DIAGNOSIS — Z809 Family history of malignant neoplasm, unspecified: Secondary | ICD-10-CM | POA: Diagnosis not present

## 2021-04-19 DIAGNOSIS — I341 Nonrheumatic mitral (valve) prolapse: Secondary | ICD-10-CM | POA: Diagnosis not present

## 2021-04-19 DIAGNOSIS — Z1389 Encounter for screening for other disorder: Secondary | ICD-10-CM | POA: Diagnosis not present

## 2021-04-19 DIAGNOSIS — R002 Palpitations: Secondary | ICD-10-CM | POA: Diagnosis not present

## 2021-04-19 DIAGNOSIS — R82998 Other abnormal findings in urine: Secondary | ICD-10-CM | POA: Diagnosis not present

## 2021-04-20 ENCOUNTER — Other Ambulatory Visit: Payer: Self-pay | Admitting: Internal Medicine

## 2021-04-20 DIAGNOSIS — Z0000 Encounter for general adult medical examination without abnormal findings: Secondary | ICD-10-CM

## 2021-04-21 DIAGNOSIS — C61 Malignant neoplasm of prostate: Secondary | ICD-10-CM | POA: Diagnosis not present

## 2021-04-21 DIAGNOSIS — N5231 Erectile dysfunction following radical prostatectomy: Secondary | ICD-10-CM | POA: Diagnosis not present

## 2021-04-27 DIAGNOSIS — R82998 Other abnormal findings in urine: Secondary | ICD-10-CM | POA: Diagnosis not present

## 2021-05-10 ENCOUNTER — Encounter: Payer: Self-pay | Admitting: Cardiovascular Disease

## 2021-05-10 ENCOUNTER — Other Ambulatory Visit: Payer: Self-pay

## 2021-05-10 ENCOUNTER — Ambulatory Visit: Payer: Medicare HMO | Admitting: Cardiovascular Disease

## 2021-05-10 VITALS — BP 128/88 | HR 56 | Ht 66.5 in | Wt 173.8 lb

## 2021-05-10 DIAGNOSIS — R002 Palpitations: Secondary | ICD-10-CM | POA: Diagnosis not present

## 2021-05-10 DIAGNOSIS — I341 Nonrheumatic mitral (valve) prolapse: Secondary | ICD-10-CM | POA: Diagnosis not present

## 2021-05-10 DIAGNOSIS — I10 Essential (primary) hypertension: Secondary | ICD-10-CM

## 2021-05-10 NOTE — Progress Notes (Signed)
Patient ID: Kenneth Booker, male   DOB: 1951/03/01, 70 y.o.   MRN: [id number]    Cardiology Office Note    Date:  05/10/2021   ID:  Kenneth Booker, DOB 10/02/1951, MRN [id number]  PCP:  Sueanne Margarita, DO  Cardiologist:   Sanda Klein, MD   Chief Complaint  Patient presents with   Cardiac Valve Problem    History of Present Illness:  Kenneth Booker is a 70 y.o. male with hypertension and mild mitral valve prolapse and palpitations, prostate cancer status post prostatectomy  He feels great except for left knee pain that prevents him from running and hurts even when he is walking.  Really does not want to, but thinks he will need to have left knee surgery this year.  Despite this he continues to ride his bike 9 miles each time, for 5 days a week.  Also continues to mow several lawns.  He denies shortness of breath, chest pain, dizziness or palpitations either at rest or with activity.  He has not noticed any reduction in stamina.    LDL 75 on recent lipid profile.  Dr. Yong Channel suggested coronary Calcium score or statin therapy but he declined.  He is scheduled for a AAA ultrasound  scan .   Past Medical History:  Diagnosis Date   Cancer [facility])    Cartilage tear 2009   right knee   PONV (postoperative nausea and vomiting)    Systemic hypertension     Past Surgical History:  Procedure Laterality Date   CARDIOVASCULAR STRESS TEST  02/20/2002   Negative adequate Bruce protocal exercise stress test, there is scintigraphic evidence of apical thinning with normal dynamic images EF 60% calculated by QGS   CATARACT EXTRACTION W/PHACO Right 05/16/2013   Procedure: CATARACT EXTRACTION PHACO AND INTRAOCULAR LENS PLACEMENT ([place]);  Surgeon: Tonny Branch, MD;  Location: [facility];  Service: Ophthalmology;  Laterality: Right;  CDE:5.25   CATARACT EXTRACTION W/PHACO Left 06/03/2013   Procedure: CATARACT EXTRACTION PHACO AND INTRAOCULAR LENS PLACEMENT ([facility]);  Surgeon: Tonny Branch, MD;  Location: [facility];   Service: Ophthalmology;  Laterality: Left;  CDE: 6.26   KNEE CARTILAGE SURGERY Right 2007?   RENAL DUPLEX  02/20/2003   Bilateral renal arteries demonstrate normal valveswith no hemodynamically significance. Bilateral kidneysare symmetrical in shape with no obvious abnormality visualized.   ROBOT ASSISTED LAPAROSCOPIC RADICAL PROSTATECTOMY N/A 11/11/2019   Procedure: XI ROBOTIC ASSISTED LAPAROSCOPIC RADICAL PROSTATECTOMY LEVEL 1;  Surgeon: Raynelle Bring, MD;  Location: [facility];  Service: Urology;  Laterality: N/A;   TRANSTHORACIC ECHOCARDIOGRAM  03/20/2012   EF >55%, suggestive of borderline impaired LV relaxation with normal tissue doppler, mild MR, mild TR.     Outpatient Medications Prior to Visit  Medication Sig Dispense Refill   irbesartan-hydrochlorothiazide (AVALIDE) 150-12.5 MG tablet TAKE 1 TABLET BY MOUTH EVERY DAY FOR BLOOD PRESSURE 90 tablet 3   No facility-administered medications prior to visit.     Allergies:   Norvasc [amlodipine besylate] and Tarka [trandolapril-verapamil hcl er]   Social History   Socioeconomic History   Marital status: Married    Spouse name: Not on file   Number of children: Not on file   Years of education: Not on file   Highest education level: Not on file  Occupational History   Not on file  Tobacco Use   Smoking status: Never   Smokeless tobacco: Never  Vaping Use   Vaping Use: Never used  Substance and Sexual Activity   Alcohol  use: No   Drug use: No   Sexual activity: Not on file  Other Topics Concern   Not on file  Social History Narrative   Not on file   Social Determinants of Health   Financial Resource Strain: Not on file  Food Insecurity: Not on file  Transportation Needs: Not on file  Physical Activity: Not on file  Stress: Not on file  Social Connections: Not on file     Family History:  The patient's family history includes Cancer in his brother, brother, and father; Cancer (age of onset: 74) in his sister; Diabetes  in his paternal grandmother; Heart attack in his mother; Hypertension in his sister and sister.   ROS:   Please see the history of present illness.    ROS  All other systems are reviewed and are negative.  PHYSICAL EXAM:   VS:  BP 128/88 (BP Location: Left Arm, Patient Position: Sitting, Cuff Size: Normal)   Pulse (!) 56   Ht 5' 6.5" (1.689 m)   Wt 173 lb 12.8 oz (78.8 kg)   SpO2 100%   BMI 27.63 kg/m      General: Alert, oriented x3, no distress, appears fit, younger than stated age, despite being mildly overweight Head: no evidence of trauma, PERRL, EOMI, no exophtalmos or lid lag, no myxedema, no xanthelasma; normal ears, nose and oropharynx Neck: normal jugular venous pulsations and no hepatojugular reflux; brisk carotid pulses without delay and no carotid bruits Chest: clear to auscultation, no signs of consolidation by percussion or palpation, normal fremitus, symmetrical and full respiratory excursions Cardiovascular: normal position and quality of the apical impulse, regular rhythm, normal first and second heart sounds, no murmurs, rubs or gallops he has a distant midsystolic click but does not have any audible murmurs even with provocative maneuvers such as Valsalva maneuver and standing. Abdomen: no tenderness or distention, no masses by palpation, no abnormal pulsatility or arterial bruits, normal bowel sounds, no hepatosplenomegaly Extremities: no clubbing, cyanosis or edema; 2+ radial, ulnar and brachial pulses bilaterally; 2+ right femoral, posterior tibial and dorsalis pedis pulses; 2+ left femoral, posterior tibial and dorsalis pedis pulses; no subclavian or femoral bruits Neurological: grossly nonfocal Psych: Normal mood and affect    Wt Readings from Last 3 Encounters:  05/10/21 173 lb 12.8 oz (78.8 kg)  04/30/20 170 lb 3.2 oz (77.2 kg)  11/11/19 177 lb 7.5 oz (80.5 kg)      Studies/Labs Reviewed:   EKG:  EKG is ordered today.  Personally reviewed, shows sinus  bradycardia otherwise completely normal Lipid Panel     Component Value Date/Time   CHOL 131 10/26/2017 0853   TRIG 68 10/26/2017 0853   HDL 48 10/26/2017 0853   CHOLHDL 2.7 10/26/2017 0853   LDLCALC 69 10/26/2017 0853   BMET    Component Value Date/Time   NA 135 11/12/2019 0421   NA 144 10/26/2017 0853   K 4.6 11/12/2019 0421   CL 105 11/12/2019 0421   CO2 23 11/12/2019 0421   GLUCOSE 161 (H) 11/12/2019 0421   BUN 23 11/12/2019 0421   BUN 10 10/26/2017 0853   CREATININE 1.38 (H) 11/12/2019 0421   CALCIUM 8.4 (L) 11/12/2019 0421   GFRNONAA 52 (L) 11/12/2019 0421   GFRAA >60 11/12/2019 0421   Labs from [place] from 04/15/2021  creatinine 0.9, potassium 4.9, normal liver function test, hemoglobin 13.9, WBC 3.9, platelets 250 4K Cholesterol 133, triglycerides 71, HDL 44, LDL 75  ASSESSMENT:  1. Heart palpitations   2. Essential hypertension   3. MVP (mitral valve prolapse)      PLAN:  In order of problems listed above:  Palpitations: Have not bothered him recently. HTN: Adequate control.  Ideally 130/80 or less. MVP: Distinct midsystolic click but no murmur and no symptoms of mitral insufficiency.  Continues to be physically active, not limited by any symptoms. Elev creatinine: Resolved. HLP: He does not have known CAD or PAD.  I do not think he requires statin therapy for the current cholesterol level.  Encouraged him to stay physically active.  Try to lose a little bit of weight.  Medication Adjustments/Labs and Tests Ordered: Current medicines are reviewed at length with the patient today.  Concerns regarding medicines are outlined above.  Medication changes, Labs and Tests ordered today are listed in the Patient Instructions below. Patient Instructions  Medication Instructions:  No changes *If you need a refill on your cardiac medications before your next appointment, please call your pharmacy*   Lab Work: None ordered If you have labs (blood work)  drawn today and your tests are completely normal, you will receive your results only by: [place] (if you have MyChart) OR A paper copy in the mail If you have any lab test that is abnormal or we need to change your treatment, we will call you to review the results.   Testing/Procedures: None ordered   Follow-Up: At [facility], you and your health needs are our priority.  As part of our continuing mission to provide you with exceptional heart care, we have created designated Provider Care Teams.  These Care Teams include your primary Cardiologist (physician) and Advanced Practice Providers (APPs -  Physician Assistants and Nurse Practitioners) who all work together to provide you with the care you need, when you need it.  We recommend signing up for the patient portal called "MyChart".  Sign up information is provided on this After Visit Summary.  MyChart is used to connect with patients for Virtual Visits (Telemedicine).  Patients are able to view lab/test results, encounter notes, upcoming appointments, etc.  Non-urgent messages can be sent to your provider as well.   To learn more about what you can do with MyChart, go to [email or web address].    Your next appointment:   12 month(s)  The format for your next appointment:   In Person  Provider:   Sanda Klein, MD       Signed, Sanda Klein, MD  05/10/2021 8:59 AM    [address] [address], [place], [place]  [address] Phone: [phone]; Fax: [phone]

## 2021-05-10 NOTE — Patient Instructions (Signed)

## 2021-05-11 ENCOUNTER — Ambulatory Visit
Admission: RE | Admit: 2021-05-11 | Discharge: 2021-05-11 | Disposition: A | Discharge status: Home / Self Care | Payer: Medicare HMO | Source: Ambulatory Visit | Attending: Internal Medicine | Admitting: Internal Medicine

## 2021-05-11 DIAGNOSIS — Z0000 Encounter for general adult medical examination without abnormal findings: Secondary | ICD-10-CM

## 2021-05-11 DIAGNOSIS — Z87891 Personal history of nicotine dependence: Secondary | ICD-10-CM | POA: Diagnosis not present

## 2021-05-11 DIAGNOSIS — Z136 Encounter for screening for cardiovascular disorders: Secondary | ICD-10-CM | POA: Diagnosis not present

## 2021-05-12 ENCOUNTER — Telehealth: Payer: Self-pay

## 2021-05-12 NOTE — Telephone Encounter (Signed)
   [place] Pre-operative Risk Assessment    Patient Name: Kenneth Booker  DOB: [birth year] MRN: [id number]  HEARTCARE STAFF:  - IMPORTANT!!!!!! Under Visit Info/Reason for Call, type in Other and utilize the format Clearance MM/DD/YY or Clearance TBD. Do not use dashes or single digits. - Please review there is not already an duplicate clearance open for this procedure. - If request is for dental extraction, please clarify the # of teeth to be extracted. - If the patient is currently at the dentist's office, call Pre-Op Callback Staff (MA/nurse) to input urgent request.  - If the patient is not currently in the dentist office, please route to the Pre-Op pool.  Request for surgical clearance:  What type of surgery is being performed? Left Total Knee Replacement   When is this surgery scheduled? TBD  What type of clearance is required (medical clearance vs. Pharmacy clearance to hold med vs. Both)? MED  Are there any medications that need to be held prior to surgery and how long? N/A  Practice name and name of physician performing surgery? Dr. Edmonia Lynch, MD [facility]   What is the office phone number? [phone]   7.   What is the office fax number? [place]: Sherri  8.   Anesthesia type (None, local, MAC, general) ? Not listed.    Yonah Tangeman B Shelsea Hangartner 05/12/2021, 5:28 PM  _________________________________________________________________   (provider comments below)

## 2021-05-13 ENCOUNTER — Ambulatory Visit: Payer: Medicare HMO | Admitting: Cardiovascular Disease

## 2021-05-13 NOTE — Telephone Encounter (Signed)
    Patient Name: Kenneth Booker  DOB: 01-16-1951 MRN: [id number]  Primary Cardiologist: Sanda Klein, MD  Chart reviewed as part of pre-operative protocol coverage. Given past medical history and time since last visit, based on ACC/AHA guidelines, Kenneth Booker would be at acceptable risk for the planned procedure without further cardiovascular testing.   He was recently seen in follow up with Dr. Sallyanne Kuster and was doing very well from a CV standpoint. He was exercising regularly without cardiac symptoms. He can perform greater than 4 METS without any difficulty. He underwent surveillance imaging which showed no evidence of abdominal aortic aneurysm with mild atherosclerosis.    The patient was advised that if he develops new symptoms prior to surgery to contact our office to arrange for a follow-up visit, and he verbalized understanding.  I will route this recommendation to the requesting party via Epic fax function and remove from pre-op pool.  Please call with questions.  Kathyrn Drown, NP 05/13/2021, 8:01 AM

## 2021-05-14 DIAGNOSIS — M25562 Pain in left knee: Secondary | ICD-10-CM | POA: Diagnosis not present

## 2021-06-22 DIAGNOSIS — M21162 Varus deformity, not elsewhere classified, left knee: Secondary | ICD-10-CM | POA: Diagnosis not present

## 2021-06-22 DIAGNOSIS — M1712 Unilateral primary osteoarthritis, left knee: Secondary | ICD-10-CM | POA: Diagnosis not present

## 2021-06-28 ENCOUNTER — Telehealth: Payer: Self-pay | Admitting: Cardiovascular Disease

## 2021-06-28 NOTE — Telephone Encounter (Signed)
.  Patient c/o Palpitations:  High priority if patient c/o lightheadedness, shortness of breath, or chest pain  How long have you had palpitations/irregular HR/ Afib? Are you having the symptoms now? Not at this time, but having them every day  Are you currently experiencing lightheadedness, SOB or CP? no  Do you have a history of afib (atrial fibrillation) or irregular heart rhythm?  yes  Have you checked your BP or HR? (document readings if available): good  Are you experiencing any other symptoms? Papitations seems to be getting worse- this have been going on for 3 weeks- patient would like to be seen

## 2021-06-28 NOTE — Telephone Encounter (Signed)
Spoke to patient he stated for the past 4 weeks he has been having frequent palpitations.Stated he was drinking a lot of beet and grape juice.He has stopped drinking.He continues to have palpitations.Stated he would like to see Dr.Croitoru.Appointment scheduled with Dr.Croitoru 9/23 at 11:30 am.Advised I will make Dr.Croitoru aware.

## 2021-07-02 ENCOUNTER — Encounter: Payer: Self-pay | Admitting: Cardiovascular Disease

## 2021-07-02 ENCOUNTER — Other Ambulatory Visit: Payer: Self-pay

## 2021-07-02 ENCOUNTER — Ambulatory Visit: Payer: Medicare HMO | Admitting: Cardiovascular Disease

## 2021-07-02 VITALS — BP 137/82 | HR 86 | Ht 66.5 in | Wt 174.0 lb

## 2021-07-02 DIAGNOSIS — R002 Palpitations: Secondary | ICD-10-CM | POA: Diagnosis not present

## 2021-07-02 DIAGNOSIS — I341 Nonrheumatic mitral (valve) prolapse: Secondary | ICD-10-CM

## 2021-07-02 DIAGNOSIS — I10 Essential (primary) hypertension: Secondary | ICD-10-CM

## 2021-07-02 NOTE — Patient Instructions (Signed)
Medication Instructions:  No changes *If you need a refill on your cardiac medications before your next appointment, please call your pharmacy*   Lab Work: None ordered If you have labs (blood work) drawn today and your tests are completely normal, you will receive your results only by: [place] (if you have MyChart) OR A paper copy in the mail If you have any lab test that is abnormal or we need to change your treatment, we will call you to review the results.   Testing/Procedures: None ordered   Follow-Up: At [facility], you and your health needs are our priority.  As part of our continuing mission to provide you with exceptional heart care, we have created designated Provider Care Teams.  These Care Teams include your primary Cardiologist (physician) and Advanced Practice Providers (APPs -  Physician Assistants and Nurse Practitioners) who all work together to provide you with the care you need, when you need it.  We recommend signing up for the patient portal called "MyChart".  Sign up information is provided on this After Visit Summary.  MyChart is used to connect with patients for Virtual Visits (Telemedicine).  Patients are able to view lab/test results, encounter notes, upcoming appointments, etc.  Non-urgent messages can be sent to your provider as well.   To learn more about what you can do with MyChart, go to [email or web address].    Your next appointment:   12 month(s)  The format for your next appointment:   In Person  Provider:   You may see Sanda Klein, MD or one of the following Advanced Practice Providers on your designated Care Team:   Almyra Deforest, PA-C Fabian Sharp, [place] or  Roby Lofts, [place]   Other Instructions  [employer]: Website: [email or web address]  DR. Sallyanne Kuster RECOMMENDS YOU PURCHASE  " Kardia" By [employer]  [employer]. FROM THE  [employer]  APP PLAY STORE.  THE APP IS FREE , BUT THE  EQUIPMENT HAS A COST. IT  ALLOWS YOU TO OBTAIN A RECORDING OF YOUR HEART RATE AND RHYTHM BY PROVIDING A SHORT STRIP THAT YOU CAN SHARE WITH YOUR PROVIDER.

## 2021-07-02 NOTE — Progress Notes (Signed)
Patient ID: Kenneth Booker, male   DOB: February 02, 1951, 70 y.o.   MRN: [id number]    Cardiology Office Note    Date:  07/03/2021   ID:  Kenneth Booker, DOB 06-01-1951, MRN [id number]  PCP:  Sueanne Margarita, DO  Cardiologist:   Sanda Klein, MD   No chief complaint on file.   History of Present Illness:  Kenneth Booker is a 70 y.o. male with hypertension and mild mitral valve prolapse and palpitations, prostate cancer status post prostatectomy.  He presents with complaints of increased frequency of palpitations.  These never occur during activity, but are noticeable at rest, typically when he lies in bed.  The palpitations are generally isolated skipped beats and they do not associate dizziness or syncope, shortness of breath or chest pain.  He reports having an ultrasound procedure in his chest and tells me that his primary care provider told him that he may have a blockage based on this.  I do not have a report.  He remains physically active and denies any chest pain or shortness of breath with riding his bicycle and mowing the lawn.  He has cut back a little bit on his bike riding due to the palpitations, which have made him a little concerned.    Past Medical History:  Diagnosis Date   Cancer [facility])    Cartilage tear 2009   right knee   PONV (postoperative nausea and vomiting)    Systemic hypertension     Past Surgical History:  Procedure Laterality Date   CARDIOVASCULAR STRESS TEST  02/20/2002   Negative adequate Bruce protocal exercise stress test, there is scintigraphic evidence of apical thinning with normal dynamic images EF 60% calculated by QGS   CATARACT EXTRACTION W/PHACO Right 05/16/2013   Procedure: CATARACT EXTRACTION PHACO AND INTRAOCULAR LENS PLACEMENT ([place]);  Surgeon: Tonny Branch, MD;  Location: [facility];  Service: Ophthalmology;  Laterality: Right;  CDE:5.25   CATARACT EXTRACTION W/PHACO Left 06/03/2013   Procedure: CATARACT EXTRACTION PHACO AND INTRAOCULAR LENS  PLACEMENT ([facility]);  Surgeon: Tonny Branch, MD;  Location: [facility];  Service: Ophthalmology;  Laterality: Left;  CDE: 6.26   KNEE CARTILAGE SURGERY Right 2007?   RENAL DUPLEX  02/20/2003   Bilateral renal arteries demonstrate normal valveswith no hemodynamically significance. Bilateral kidneysare symmetrical in shape with no obvious abnormality visualized.   ROBOT ASSISTED LAPAROSCOPIC RADICAL PROSTATECTOMY N/A 11/11/2019   Procedure: XI ROBOTIC ASSISTED LAPAROSCOPIC RADICAL PROSTATECTOMY LEVEL 1;  Surgeon: Raynelle Bring, MD;  Location: [facility];  Service: Urology;  Laterality: N/A;   TRANSTHORACIC ECHOCARDIOGRAM  03/20/2012   EF >55%, suggestive of borderline impaired LV relaxation with normal tissue doppler, mild MR, mild TR.     Outpatient Medications Prior to Visit  Medication Sig Dispense Refill   irbesartan-hydrochlorothiazide (AVALIDE) 150-12.5 MG tablet TAKE 1 TABLET BY MOUTH EVERY DAY FOR BLOOD PRESSURE 90 tablet 3   No facility-administered medications prior to visit.     Allergies:   Norvasc [amlodipine besylate] and Tarka [trandolapril-verapamil hcl er]   Social History   Socioeconomic History   Marital status: Married    Spouse name: Not on file   Number of children: Not on file   Years of education: Not on file   Highest education level: Not on file  Occupational History   Not on file  Tobacco Use   Smoking status: Never   Smokeless tobacco: Never  Vaping Use   Vaping Use: Never used  Substance and Sexual Activity  Alcohol use: No   Drug use: No   Sexual activity: Not on file  Other Topics Concern   Not on file  Social History Narrative   Not on file   Social Determinants of Health   Financial Resource Strain: Not on file  Food Insecurity: Not on file  Transportation Needs: Not on file  Physical Activity: Not on file  Stress: Not on file  Social Connections: Not on file     Family History:  The patient's family history includes Cancer in his brother, brother,  and father; Cancer (age of onset: 90) in his sister; Diabetes in his paternal grandmother; Heart attack in his mother; Hypertension in his sister and sister.   ROS:   Please see the history of present illness.    ROS  All other systems are reviewed and are negative.  PHYSICAL EXAM:   VS:  BP 137/82   Pulse 86   Ht 5' 6.5" (1.689 m)   Wt 78.9 kg   SpO2 95%   BMI 27.66 kg/m      General: Alert, oriented x3, no distress, minimally overweight, but also appears fit Head: no evidence of trauma, PERRL, EOMI, no exophtalmos or lid lag, no myxedema, no xanthelasma; normal ears, nose and oropharynx Neck: normal jugular venous pulsations and no hepatojugular reflux; brisk carotid pulses without delay and no carotid bruits Chest: clear to auscultation, no signs of consolidation by percussion or palpation, normal fremitus, symmetrical and full respiratory excursions Cardiovascular: normal position and quality of the apical impulse, regular rhythm, normal first and second heart sounds, no murmurs, rubs or gallops.  He has a distant midsystolic click, but I cannot elicit a murmur with standing quite a Valsalva maneuver. Abdomen: no tenderness or distention, no masses by palpation, no abnormal pulsatility or arterial bruits, normal bowel sounds, no hepatosplenomegaly Extremities: no clubbing, cyanosis or edema; 2+ radial, ulnar and brachial pulses bilaterally; 2+ right femoral, posterior tibial and dorsalis pedis pulses; 2+ left femoral, posterior tibial and dorsalis pedis pulses; no subclavian or femoral bruits Neurological: grossly nonfocal Psych: Normal mood and affect     Wt Readings from Last 3 Encounters:  07/02/21 78.9 kg  05/10/21 78.8 kg  04/30/20 77.2 kg      Studies/Labs Reviewed:   EKG:  EKG is not ordered today.  Personally reviewed the tracing from 05/10/2021 which shows sinus bradycardia but is otherwise normal,  Lipid Panel     Component Value Date/Time   CHOL 131  10/26/2017 0853   TRIG 68 10/26/2017 0853   HDL 48 10/26/2017 0853   CHOLHDL 2.7 10/26/2017 0853   LDLCALC 69 10/26/2017 0853   BMET    Component Value Date/Time   NA 135 11/12/2019 0421   NA 144 10/26/2017 0853   K 4.6 11/12/2019 0421   CL 105 11/12/2019 0421   CO2 23 11/12/2019 0421   GLUCOSE 161 (H) 11/12/2019 0421   BUN 23 11/12/2019 0421   BUN 10 10/26/2017 0853   CREATININE 1.38 (H) 11/12/2019 0421   CALCIUM 8.4 (L) 11/12/2019 0421   GFRNONAA 52 (L) 11/12/2019 0421   GFRAA >60 11/12/2019 0421   Labs from [facility] from 04/15/2021  creatinine 0.9, potassium 4.9, normal liver function test, hemoglobin 13.9, WBC 3.9, platelets 250 4K Cholesterol 133, triglycerides 71, HDL 44, LDL 75  ASSESSMENT:    1. Heart palpitations   2. Essential hypertension   3. MVP (mitral valve prolapse)       PLAN:  In order  of problems listed above:  Palpitations: Recently increased in frequency.  I think he is describing isolated PACs or PVCs.  The latter are more likely considering the physical findings to suggest mitral valve prolapse.  The episodes are not frequent enough to be readily caught on an event monitor.  I suggested that he purchase a device such as a Kardiamobile monitor or smart watch.  His son can help send his recordings through [facility]. HTN: Well-controlled. MVP: Distinct midsystolic click but no murmur and no symptoms of mitral insufficiency.  Continues to be physically active, not limited by any symptoms.  We will try to look at the report from the echocardiogram that was apparently performed at [facility]. HLP: Not sure how the diagnosis of "blockage" was made.  We will try to get that report.  To my knowledge he does not have CAD or PAD and I do not see an indication for lipid-lowering therapy in this gentleman, although he should continue to eat a healthy diet and exercise regularly.  His LDL has indeed increased slowly in the last couple of years.  Cadel is  reluctant to take any lipid-lowering medications at this time.  Medication Adjustments/Labs and Tests Ordered: Current medicines are reviewed at length with the patient today.  Concerns regarding medicines are outlined above.  Medication changes, Labs and Tests ordered today are listed in the Patient Instructions below. Patient Instructions  Medication Instructions:  No changes *If you need a refill on your cardiac medications before your next appointment, please call your pharmacy*   Lab Work: None ordered If you have labs (blood work) drawn today and your tests are completely normal, you will receive your results only by: [place] (if you have MyChart) OR A paper copy in the mail If you have any lab test that is abnormal or we need to change your treatment, we will call you to review the results.   Testing/Procedures: None ordered   Follow-Up: At [facility], you and your health needs are our priority.  As part of our continuing mission to provide you with exceptional heart care, we have created designated Provider Care Teams.  These Care Teams include your primary Cardiologist (physician) and Advanced Practice Providers (APPs -  Physician Assistants and Nurse Practitioners) who all work together to provide you with the care you need, when you need it.  We recommend signing up for the patient portal called "MyChart".  Sign up information is provided on this After Visit Summary.  MyChart is used to connect with patients for Virtual Visits (Telemedicine).  Patients are able to view lab/test results, encounter notes, upcoming appointments, etc.  Non-urgent messages can be sent to your provider as well.   To learn more about what you can do with MyChart, go to [email or web address].    Your next appointment:   12 month(s)  The format for your next appointment:   In Person  Provider:   You may see Sanda Klein, MD or one of the following Advanced Practice Providers on  your designated Care Team:   Almyra Deforest, PA-C Fabian Sharp, [place] or  Roby Lofts, [place]   Other Instructions  [employer]: Website: [email or web address]  DR. Sallyanne Kuster RECOMMENDS YOU PURCHASE  " Kardia" By [employer]  [employer]. FROM THE  [employer]  APP PLAY STORE.  THE APP IS FREE , BUT THE  EQUIPMENT HAS A COST. IT ALLOWS YOU TO OBTAIN A RECORDING OF YOUR HEART RATE AND RHYTHM BY PROVIDING A SHORT STRIP THAT YOU  CAN SHARE WITH YOUR PROVIDER.        Signed, Sanda Klein, MD  07/03/2021 6:06 AM    [facility] [place], [place], [place]  [address] Phone: [phone]; Fax: [phone]

## 2021-07-03 ENCOUNTER — Encounter: Payer: Self-pay | Admitting: Cardiovascular Disease

## 2021-07-08 ENCOUNTER — Other Ambulatory Visit: Payer: Self-pay | Admitting: Cardiovascular Disease

## 2021-08-09 ENCOUNTER — Ambulatory Visit: Payer: Medicare HMO | Admitting: Dermatology

## 2021-08-18 ENCOUNTER — Other Ambulatory Visit: Payer: Self-pay | Admitting: *Deleted

## 2021-08-18 DIAGNOSIS — I341 Nonrheumatic mitral (valve) prolapse: Secondary | ICD-10-CM

## 2021-08-23 DIAGNOSIS — M25562 Pain in left knee: Secondary | ICD-10-CM | POA: Diagnosis not present

## 2021-08-30 DIAGNOSIS — M25662 Stiffness of left knee, not elsewhere classified: Secondary | ICD-10-CM | POA: Diagnosis not present

## 2021-08-30 DIAGNOSIS — M6281 Muscle weakness (generalized): Secondary | ICD-10-CM | POA: Diagnosis not present

## 2021-08-30 DIAGNOSIS — M1712 Unilateral primary osteoarthritis, left knee: Secondary | ICD-10-CM | POA: Diagnosis not present

## 2021-09-06 ENCOUNTER — Ambulatory Visit (HOSPITAL_COMMUNITY): Payer: Medicare HMO | Attending: Cardiology

## 2021-09-06 ENCOUNTER — Encounter (INDEPENDENT_AMBULATORY_CARE_PROVIDER_SITE_OTHER): Payer: Self-pay

## 2021-09-06 ENCOUNTER — Other Ambulatory Visit: Payer: Self-pay

## 2021-09-06 DIAGNOSIS — I341 Nonrheumatic mitral (valve) prolapse: Secondary | ICD-10-CM | POA: Diagnosis not present

## 2021-09-06 LAB — ECHOCARDIOGRAM COMPLETE
Area-P 1/2: 2.76 cm2
S' Lateral: 3 cm

## 2021-09-17 ENCOUNTER — Ambulatory Visit: Payer: Medicare HMO | Admitting: Internal Medicine

## 2021-09-23 DIAGNOSIS — M1712 Unilateral primary osteoarthritis, left knee: Secondary | ICD-10-CM | POA: Diagnosis not present

## 2021-09-23 DIAGNOSIS — G8918 Other acute postprocedural pain: Secondary | ICD-10-CM | POA: Diagnosis not present

## 2021-09-23 HISTORY — PX: JOINT REPLACEMENT: SHX530

## 2021-09-24 DIAGNOSIS — M1712 Unilateral primary osteoarthritis, left knee: Secondary | ICD-10-CM | POA: Diagnosis not present

## 2021-09-24 DIAGNOSIS — Z96652 Presence of left artificial knee joint: Secondary | ICD-10-CM | POA: Diagnosis not present

## 2021-09-29 ENCOUNTER — Ambulatory Visit (INDEPENDENT_AMBULATORY_CARE_PROVIDER_SITE_OTHER): Payer: Medicare HMO | Admitting: Internal Medicine

## 2021-09-29 ENCOUNTER — Other Ambulatory Visit: Payer: Self-pay

## 2021-09-29 ENCOUNTER — Encounter: Payer: Self-pay | Admitting: Internal Medicine

## 2021-09-29 VITALS — BP 138/88 | HR 91 | Resp 17 | Ht 66.0 in | Wt 174.0 lb

## 2021-09-29 DIAGNOSIS — M6281 Muscle weakness (generalized): Secondary | ICD-10-CM | POA: Diagnosis not present

## 2021-09-29 DIAGNOSIS — R002 Palpitations: Secondary | ICD-10-CM | POA: Diagnosis not present

## 2021-09-29 DIAGNOSIS — Z96652 Presence of left artificial knee joint: Secondary | ICD-10-CM | POA: Diagnosis not present

## 2021-09-29 DIAGNOSIS — M1712 Unilateral primary osteoarthritis, left knee: Secondary | ICD-10-CM | POA: Diagnosis not present

## 2021-09-29 DIAGNOSIS — E782 Mixed hyperlipidemia: Secondary | ICD-10-CM | POA: Diagnosis not present

## 2021-09-29 DIAGNOSIS — Z8546 Personal history of malignant neoplasm of prostate: Secondary | ICD-10-CM

## 2021-09-29 DIAGNOSIS — R262 Difficulty in walking, not elsewhere classified: Secondary | ICD-10-CM | POA: Diagnosis not present

## 2021-09-29 DIAGNOSIS — M25662 Stiffness of left knee, not elsewhere classified: Secondary | ICD-10-CM | POA: Diagnosis not present

## 2021-09-29 DIAGNOSIS — I10 Essential (primary) hypertension: Secondary | ICD-10-CM | POA: Diagnosis not present

## 2021-09-29 DIAGNOSIS — Z1159 Encounter for screening for other viral diseases: Secondary | ICD-10-CM

## 2021-09-29 NOTE — Assessment & Plan Note (Signed)
Recently about 2 weeks ago, healing well Undergoing PT Followed by Orthopedic surgery

## 2021-09-29 NOTE — Assessment & Plan Note (Signed)
S/p total prostatectomy Followed by Dr Drue Dun PSA as per patient request, to be done before Urology visit

## 2021-09-29 NOTE — Progress Notes (Signed)
New Patient Office Visit  Subjective:  Patient ID: Kenneth Booker, male    DOB: Oct 18, 1950  Age: 70 y.o. MRN: [id number]  CC:  Chief Complaint  Patient presents with   Establish Care    HPI Kenneth Booker is a 70 y.o. male with past medical history of HTN, prostate cancer s/p total prostatectomy and OA of knee who presents for establishing care.  HTN: BP is well-controlled. Takes medications regularly. Patient denies headache, dizziness, chest pain, dyspnea.  He has history of chronic palpitations.  He reports that he takes beet juice every day and he was concerned about hypokalemia with it.  He started taking his antihypertensive at bedtime, which has helped with his palpitations now.  History of prostate cancer s/p total prostatectomy: Followed by Dr. Alinda Money.  PSA is undetectable since surgery.  Denies any dysuria, hematuria or urinary hesitancy or resistance.  He recently had left TKA for OA of knee.  He is under going PT and is followed by orthopedic surgeon.  His pain is controlled currently.  Denies any incision site discharge, warmth or swelling.  He has had both pneumococcal and Shingrix vaccines.  He also has had 3 doses of COVID-vaccine and flu vaccine.  Past Medical History:  Diagnosis Date   Cancer [facility])    Cartilage tear 2009   right knee   PONV (postoperative nausea and vomiting)    Systemic hypertension     Past Surgical History:  Procedure Laterality Date   CARDIOVASCULAR STRESS TEST  02/20/2002   Negative adequate Bruce protocal exercise stress test, there is scintigraphic evidence of apical thinning with normal dynamic images EF 60% calculated by QGS   CATARACT EXTRACTION W/PHACO Right 05/16/2013   Procedure: CATARACT EXTRACTION PHACO AND INTRAOCULAR LENS PLACEMENT ([place]);  Surgeon: Tonny Branch, MD;  Location: [facility];  Service: Ophthalmology;  Laterality: Right;  CDE:5.25   CATARACT EXTRACTION W/PHACO Left 06/03/2013   Procedure: CATARACT EXTRACTION PHACO AND  INTRAOCULAR LENS PLACEMENT ([facility]);  Surgeon: Tonny Branch, MD;  Location: [facility];  Service: Ophthalmology;  Laterality: Left;  CDE: 6.26   KNEE CARTILAGE SURGERY Right 2007?   RENAL DUPLEX  02/20/2003   Bilateral renal arteries demonstrate normal valveswith no hemodynamically significance. Bilateral kidneysare symmetrical in shape with no obvious abnormality visualized.   ROBOT ASSISTED LAPAROSCOPIC RADICAL PROSTATECTOMY N/A 11/11/2019   Procedure: XI ROBOTIC ASSISTED LAPAROSCOPIC RADICAL PROSTATECTOMY LEVEL 1;  Surgeon: Raynelle Bring, MD;  Location: [facility];  Service: Urology;  Laterality: N/A;   TRANSTHORACIC ECHOCARDIOGRAM  03/20/2012   EF >55%, suggestive of borderline impaired LV relaxation with normal tissue doppler, mild MR, mild TR.     Family History  Problem Relation Age of Onset   Heart attack Mother    Cancer Father        Lung cancer   Hypertension Sister    Cancer Sister 71       Breast cancer   Cancer Brother        Colon cancer   Diabetes Paternal Grandmother    Hypertension Sister    Cancer Brother        Stomach cancer    Social History   Socioeconomic History   Marital status: Married    Spouse name: Not on file   Number of children: Not on file   Years of education: Not on file   Highest education level: Not on file  Occupational History   Not on file  Tobacco Use   Smoking status: Never  Smokeless tobacco: Never  Vaping Use   Vaping Use: Never used  Substance and Sexual Activity   Alcohol use: No   Drug use: No   Sexual activity: Not on file  Other Topics Concern   Not on file  Social History Narrative   Not on file   Social Determinants of Health   Financial Resource Strain: Not on file  Food Insecurity: Not on file  Transportation Needs: Not on file  Physical Activity: Not on file  Stress: Not on file  Social Connections: Not on file  Intimate Partner Violence: Not on file    ROS Review of Systems  Constitutional:  Negative for chills  and fever.  HENT:  Negative for congestion and sore throat.   Eyes:  Negative for pain and discharge.  Respiratory:  Negative for cough and shortness of breath.   Cardiovascular:  Negative for chest pain and palpitations.  Gastrointestinal:  Negative for constipation, diarrhea, nausea and vomiting.  Endocrine: Negative for polydipsia and polyuria.  Genitourinary:  Negative for dysuria and hematuria.  Musculoskeletal:  Positive for arthralgias. Negative for neck pain and neck stiffness.  Skin:  Negative for rash.  Neurological:  Negative for dizziness, weakness, numbness and headaches.  Psychiatric/Behavioral:  Negative for agitation and behavioral problems.    Objective:   Today's Vitals: BP 138/88 (BP Location: Right Arm)    Pulse 91    Resp 17    Ht '5\' 6"'  (1.676 m)    Wt 174 lb (78.9 kg)    SpO2 95%    BMI 28.08 kg/m   Physical Exam Vitals reviewed.  Constitutional:      General: He is not in acute distress.    Appearance: He is not diaphoretic.  HENT:     Head: Normocephalic and atraumatic.     Nose: Nose normal.     Mouth/Throat:     Mouth: Mucous membranes are moist.  Eyes:     General: No scleral icterus.    Extraocular Movements: Extraocular movements intact.  Cardiovascular:     Rate and Rhythm: Normal rate and regular rhythm.     Pulses: Normal pulses.     Heart sounds: Normal heart sounds. No murmur heard. Pulmonary:     Breath sounds: Normal breath sounds. No wheezing or rales.  Musculoskeletal:     Cervical back: Neck supple. No tenderness.     Right lower leg: No edema.     Left lower leg: No edema.     Comments: S/p left TKA, incision site C/D/I  Skin:    General: Skin is warm.     Findings: No rash.  Neurological:     General: No focal deficit present.     Mental Status: He is alert and oriented to person, place, and time.     Sensory: No sensory deficit.     Motor: No weakness.  Psychiatric:        Mood and Affect: Mood normal.        Behavior:  Behavior normal.    Assessment & Plan:   Problem List Items Addressed This Visit       Cardiovascular and Mediastinum   Essential hypertension - Primary    BP Readings from Last 1 Encounters:  09/29/21 138/88  Well-controlled with Irbesartan-HCTZ Counseled for compliance with the medications Advised DASH diet and moderate exercise/walking, at least 150 mins/week       Relevant Orders   CMP14+EGFR   Lipid Profile     Other  Heart palpitations    Followed by Cardiology Recent Echo was normal He has tried taking his antihypertensive at bedtime, which has improved palpitations.      History of prostate cancer    S/p total prostatectomy Followed by Dr Drue Dun PSA as per patient request, to be done before Urology visit      Relevant Orders   PSA   Urinalysis   S/P TKR (total knee replacement), left    Recently about 2 weeks ago, healing well Undergoing PT Followed by Orthopedic surgery      Mixed hyperlipidemia    Reports h/o HLD Check lipid profile      Other Visit Diagnoses     Need for hepatitis C screening test       Relevant Orders   Hepatitis C Antibody       Outpatient Encounter Medications as of 09/29/2021  Medication Sig   irbesartan-hydrochlorothiazide (AVALIDE) 150-12.5 MG tablet TAKE 1 TABLET BY MOUTH EVERY DAY FOR BLOOD PRESSURE   No facility-administered encounter medications on file as of 09/29/2021.    Follow-up: Return in about 6 months (around 03/30/2022) for HTN.   Lindell Spar, MD

## 2021-09-29 NOTE — Assessment & Plan Note (Signed)
BP Readings from Last 1 Encounters:  09/29/21 138/88   Well-controlled with Irbesartan-HCTZ Counseled for compliance with the medications Advised DASH diet and moderate exercise/walking, at least 150 mins/week

## 2021-09-29 NOTE — Patient Instructions (Addendum)
Please continue to take medications as prescribed. ° °Please continue to follow low salt diet and ambulate as tolerated. ° °Please get fasting blood tests done before the next visit. °

## 2021-09-29 NOTE — Assessment & Plan Note (Signed)
Reports h/o HLD Check lipid profile

## 2021-09-29 NOTE — Assessment & Plan Note (Signed)
Followed by Cardiology Recent Echo was normal He has tried taking his antihypertensive at bedtime, which has improved palpitations. 

## 2021-10-06 DIAGNOSIS — M25662 Stiffness of left knee, not elsewhere classified: Secondary | ICD-10-CM | POA: Diagnosis not present

## 2021-10-06 DIAGNOSIS — M6281 Muscle weakness (generalized): Secondary | ICD-10-CM | POA: Diagnosis not present

## 2021-10-06 DIAGNOSIS — R262 Difficulty in walking, not elsewhere classified: Secondary | ICD-10-CM | POA: Diagnosis not present

## 2021-10-06 DIAGNOSIS — M1712 Unilateral primary osteoarthritis, left knee: Secondary | ICD-10-CM | POA: Diagnosis not present

## 2021-10-09 DIAGNOSIS — M1712 Unilateral primary osteoarthritis, left knee: Secondary | ICD-10-CM | POA: Diagnosis not present

## 2021-10-09 DIAGNOSIS — Z96652 Presence of left artificial knee joint: Secondary | ICD-10-CM | POA: Diagnosis not present

## 2021-10-10 DIAGNOSIS — Z96652 Presence of left artificial knee joint: Secondary | ICD-10-CM | POA: Diagnosis not present

## 2021-10-10 DIAGNOSIS — M1712 Unilateral primary osteoarthritis, left knee: Secondary | ICD-10-CM | POA: Diagnosis not present

## 2021-10-19 ENCOUNTER — Ambulatory Visit (INDEPENDENT_AMBULATORY_CARE_PROVIDER_SITE_OTHER): Payer: Medicare HMO

## 2021-10-19 ENCOUNTER — Other Ambulatory Visit: Payer: Self-pay

## 2021-10-19 DIAGNOSIS — Z0000 Encounter for general adult medical examination without abnormal findings: Secondary | ICD-10-CM

## 2021-10-19 NOTE — Progress Notes (Signed)
Subjective:   Kenneth Booker is a 71 y.o. male who presents for an Initial Medicare Annual Wellness Visit. I connected with  Andreas Ohm on 10/19/21 by a audio enabled telemedicine application and verified that I am speaking with the correct person using two identifiers.  Patient Location: Home  Provider Location: Office/Clinic  I discussed the limitations of evaluation and management by telemedicine. The patient expressed understanding and agreed to proceed.  Review of Systems    Defer to PCP Cardiac Risk Factors include: advanced age (>16men, >17 women);hypertension;male gender     Objective:    There were no vitals filed for this visit. There is no height or weight on file to calculate BMI.  Advanced Directives 10/19/2021 11/11/2019 11/07/2019 06/03/2013 05/07/2013  Does Patient Have a Medical Advance Directive? No Yes Yes Patient does not have advance directive;Patient would not like information Patient does not have advance directive  Type of Advance Directive - [place];Living will [place];Living will - -  Does patient want to make changes to medical advance directive? - No - Patient declined - - -  Copy of [place] in Chart? - No - copy requested - - -  Would patient like information on creating a medical advance directive? Yes (ED - Information included in AVS) - - - -    Current Medications (verified) Outpatient Encounter Medications as of 10/19/2021  Medication Sig   irbesartan-hydrochlorothiazide (AVALIDE) 150-12.5 MG tablet TAKE 1 TABLET BY MOUTH EVERY DAY FOR BLOOD PRESSURE   No facility-administered encounter medications on file as of 10/19/2021.    Allergies (verified) Norvasc [amlodipine besylate] and Tarka [trandolapril-verapamil hcl er]   History: Past Medical History:  Diagnosis Date   Cancer ([place])    Cartilage tear 2009   right knee   PONV (postoperative nausea and vomiting)    Systemic  hypertension    Past Surgical History:  Procedure Laterality Date   CARDIOVASCULAR STRESS TEST  02/20/2002   Negative adequate Bruce protocal exercise stress test, there is scintigraphic evidence of apical thinning with normal dynamic images EF 60% calculated by QGS   CATARACT EXTRACTION W/PHACO Right 05/16/2013   Procedure: CATARACT EXTRACTION PHACO AND INTRAOCULAR LENS PLACEMENT ([place]);  Surgeon: Tonny Branch, MD;  Location: [facility];  Service: Ophthalmology;  Laterality: Right;  CDE:5.25   CATARACT EXTRACTION W/PHACO Left 06/03/2013   Procedure: CATARACT EXTRACTION PHACO AND INTRAOCULAR LENS PLACEMENT ([facility]);  Surgeon: Tonny Branch, MD;  Location: [facility];  Service: Ophthalmology;  Laterality: Left;  CDE: 6.26   JOINT REPLACEMENT Left 09/23/2021   Done by Dr Percell Miller   KNEE CARTILAGE SURGERY Right 2007?   RENAL DUPLEX  02/20/2003   Bilateral renal arteries demonstrate normal valveswith no hemodynamically significance. Bilateral kidneysare symmetrical in shape with no obvious abnormality visualized.   ROBOT ASSISTED LAPAROSCOPIC RADICAL PROSTATECTOMY N/A 11/11/2019   Procedure: XI ROBOTIC ASSISTED LAPAROSCOPIC RADICAL PROSTATECTOMY LEVEL 1;  Surgeon: Raynelle Bring, MD;  Location: [facility];  Service: Urology;  Laterality: N/A;   TRANSTHORACIC ECHOCARDIOGRAM  03/20/2012   EF >55%, suggestive of borderline impaired LV relaxation with normal tissue doppler, mild MR, mild TR.    Family History  Problem Relation Age of Onset   Heart attack Mother    Cancer Father        Lung cancer   Hypertension Sister    Cancer Sister 72       Breast cancer   Cancer Brother  Colon cancer   Diabetes Paternal Grandmother    Hypertension Sister    Cancer Brother        Stomach cancer   Social History   Socioeconomic History   Marital status: Married    Spouse name: Not on file   Number of children: Not on file   Years of education: Not on file   Highest education level: Not on file  Occupational  History   Not on file  Tobacco Use   Smoking status: Never   Smokeless tobacco: Never  Vaping Use   Vaping Use: Never used  Substance and Sexual Activity   Alcohol use: No   Drug use: No   Sexual activity: Not Currently  Other Topics Concern   Not on file  Social History Narrative   Not on file   Social Determinants of Health   Financial Resource Strain: Low Risk    Difficulty of Paying Living Expenses: Not hard at all  Food Insecurity: No Food Insecurity   Worried About Charity fundraiser in the Last Year: Never true   Muse in the Last Year: Never true  Transportation Needs: No Transportation Needs   Lack of Transportation (Medical): No   Lack of Transportation (Non-Medical): No  Physical Activity: Inactive   Days of Exercise per Week: 0 days   Minutes of Exercise per Session: 0 min  Stress: No Stress Concern Present   Feeling of Stress : Not at all  Social Connections: Socially Integrated   Frequency of Communication with Friends and Family: More than three times a week   Frequency of Social Gatherings with Friends and Family: Once a week   Attends Religious Services: More than 4 times per year   Active Member of [employer] or Organizations: Yes   Attends Archivist Meetings: Never   Marital Status: Married    Tobacco Counseling Counseling given: Not Answered   Clinical Intake:  Pre-visit preparation completed: No  Pain : No/denies pain     Nutritional Risks: None Diabetes: No  How often do you need to have someone help you when you read instructions, pamphlets, or other written materials from your doctor or pharmacy?: 1 - Never What is the last grade level you completed in school?: 10th  Diabetic?no  Interpreter Needed?: No  Information entered by :: Rosealee Recinos   Activities of Daily Living In your present state of health, do you have any difficulty performing the following activities: 10/19/2021  Hearing? N  Vision? N  Difficulty  concentrating or making decisions? N  Walking or climbing stairs? N  Dressing or bathing? N  Doing errands, shopping? N  Preparing Food and eating ? N  Using the Toilet? N  In the past six months, have you accidently leaked urine? Y  Do you have problems with loss of bowel control? N  Managing your Medications? N  Managing your Finances? N  Housekeeping or managing your Housekeeping? N  Some recent data might be hidden    Patient Care Team: Lindell Spar, MD as PCP - General (Internal Medicine) Croitoru, Dani Gobble, MD as PCP - Cardiology (Cardiology)  Indicate any recent Medical Services you may have received from other than [facility] providers in the past year (date may be approximate).     Assessment:   This is a routine wellness examination for Kenneth Booker.  Hearing/Vision screen No results found.  Dietary issues and exercise activities discussed: Current Exercise Habits: Home exercise routine, Exercise limited by: None  identified;orthopedic condition(s) (Pt has had a knee replacement recently)   Goals Addressed   None   Depression Screen PHQ 2/9 Scores 10/19/2021 09/29/2021  PHQ - 2 Score 1 0    Fall Risk Fall Risk  10/19/2021 09/29/2021  Falls in the past year? 0 0  Number falls in past yr: 0 0  Injury with Fall? 0 0  Follow up Falls evaluation completed -    FALL RISK PREVENTION PERTAINING TO THE HOME:  Any stairs in or around the home? Yes  If so, are there any without handrails? Yes  Home free of loose throw rugs in walkways, pet beds, electrical cords, etc? Yes  Adequate lighting in your home to reduce risk of falls? Yes   ASSISTIVE DEVICES UTILIZED TO PREVENT FALLS:  Life alert? No  Use of a cane, walker or w/c? No  Grab bars in the bathroom? Yes  Shower chair or bench in shower? Yes  Elevated toilet seat or a handicapped toilet? Yes     Cognitive Function:     6CIT Screen 10/19/2021  What Year? 0 points  What month? 0 points  What time? 0 points  Count  back from 20 0 points  Months in reverse 0 points  Repeat phrase 2 points  Total Score 2    Immunizations Immunization History  Administered Date(s) Administered   Fluad Quad(high Dose 65+) 07/07/2021   Moderna SARS-COV2 Booster Vaccination 11/09/2020   Moderna Sars-Covid-2 Vaccination 12/12/2019, 01/17/2020    TDAP status: Due, Education has been provided regarding the importance of this vaccine. Advised may receive this vaccine at local pharmacy or Health Dept. Aware to provide a copy of the vaccination record if obtained from local pharmacy or Health Dept. Verbalized acceptance and understanding.  Flu Vaccine status: Up to date  Pneumococcal vaccine status: Due, Education has been provided regarding the importance of this vaccine. Advised may receive this vaccine at local pharmacy or Health Dept. Aware to provide a copy of the vaccination record if obtained from local pharmacy or Health Dept. Verbalized acceptance and understanding.  Covid-19 vaccine status: Information provided on how to obtain vaccines.   Qualifies for Shingles Vaccine? Yes   Zostavax completed No   Shingrix Completed?: No.    Education has been provided regarding the importance of this vaccine. Patient has been advised to call insurance company to determine out of pocket expense if they have not yet received this vaccine. Advised may also receive vaccine at local pharmacy or Health Dept. Verbalized acceptance and understanding.  Screening Tests Health Maintenance  Topic Date Due   Hepatitis C Screening  Never done   TETANUS/TDAP  Never done   COLONOSCOPY (Pts 45-42yrs Insurance coverage will need to be confirmed)  Never done   Zoster Vaccines- Shingrix (1 of 2) Never done   Pneumonia Vaccine 66+ Years old (1 - PCV) Never done   COVID-19 Vaccine (3 - Booster for Moderna series) 01/04/2021   INFLUENZA VACCINE  Completed   HPV VACCINES  Aged Out    Health Maintenance  Health Maintenance Due  Topic Date  Due   Hepatitis C Screening  Never done   TETANUS/TDAP  Never done   COLONOSCOPY (Pts 45-4yrs Insurance coverage will need to be confirmed)  Never done   Zoster Vaccines- Shingrix (1 of 2) Never done   Pneumonia Vaccine 66+ Years old (1 - PCV) Never done   COVID-19 Vaccine (3 - Booster for Moderna series) 01/04/2021    Colorectal cancer screening: Type  of screening: Colonoscopy. Completed 02/2019?2021. Repeat every 10 Dr Earlean Shawl years  Lung Cancer Screening: (Low Dose CT Chest recommended if Age 18-80 years, 30 pack-year currently smoking OR have quit w/in 15years.) does not qualify.   Lung Cancer Screening Referral: n/a  Additional Screening:  Hepatitis C Screening: does qualify; Completed will complete at next visit  Vision Screening: Recommended annual ophthalmology exams for early detection of glaucoma and other disorders of the eye. Is the patient up to date with their annual eye exam?  Yes  Who is the provider or what is the name of the office in which the patient attends annual eye exams? [facility] If pt is not established with a provider, would they like to be referred to a provider to establish care? No .   Dental Screening: Recommended annual dental exams for proper oral hygiene  Community Resource Referral / Chronic Care Management: CRR required this visit?  No   CCM required this visit?  No      Plan:     I have personally reviewed and noted the following in the patients chart:   Medical and social history Use of alcohol, tobacco or illicit drugs  Current medications and supplements including opioid prescriptions. Patient is not currently taking opioid prescriptions. Functional ability and status Nutritional status Physical activity Advanced directives List of other physicians Hospitalizations, surgeries, and ER visits in previous 12 months Vitals Screenings to include cognitive, depression, and falls Referrals and appointments  In addition, I have  reviewed and discussed with patient certain preventive protocols, quality metrics, and best practice recommendations. A written personalized care plan for preventive services as well as general preventive health recommendations were provided to patient.     Earline Mayotte, [place]   10/19/2021   Nurse Notes:  Kenneth Booker , Thank you for taking time to come for your Medicare Wellness Visit. I appreciate your ongoing commitment to your health goals. Please review the following plan we discussed and let me know if I can assist you in the future.   These are the goals we discussed:  Goals   None     This is a list of the screening recommended for you and due dates:  Health Maintenance  Topic Date Due   Hepatitis C Screening: USPSTF Recommendation to screen - Ages 69-79 yo.  Never done   Tetanus Vaccine  Never done   Colon Cancer Screening  Never done   Zoster (Shingles) Vaccine (1 of 2) Never done   Pneumonia Vaccine (1 - PCV) Never done   COVID-19 Vaccine (3 - Booster for Moderna series) 01/04/2021   Flu Shot  Completed   HPV Vaccine  Aged Out

## 2021-10-19 NOTE — Patient Instructions (Addendum)
Mr. Kenneth Booker , Thank you for taking time to come for your Medicare Wellness Visit. I appreciate your ongoing commitment to your health goals. Please review the following plan we discussed and let me know if I can assist you in the future.   These are the goals we discussed:  Goals   None     This is a list of the screening recommended for you and due dates:  Health Maintenance  Topic Date Due   Hepatitis C Screening: USPSTF Recommendation to screen - Ages 73-79 yo.  Never done   Tetanus Vaccine  Never done   Colon Cancer Screening  Never done   Zoster (Shingles) Vaccine (1 of 2) Never done   Pneumonia Vaccine (1 - PCV) Never done   COVID-19 Vaccine (3 - Booster for Moderna series) 01/04/2021   Flu Shot  Completed   HPV Vaccine  Aged Out    Health Maintenance, Male Adopting a healthy lifestyle and getting preventive care are important in promoting health and wellness. Ask your health care provider about: The right schedule for you to have regular tests and exams. Things you can do on your own to prevent diseases and keep yourself healthy. What should I know about diet, weight, and exercise? Eat a healthy diet  Eat a diet that includes plenty of vegetables, fruits, low-fat dairy products, and lean protein. Do not eat a lot of foods that are high in solid fats, added sugars, or sodium. Maintain a healthy weight Body mass index (BMI) is a measurement that can be used to identify possible weight problems. It estimates body fat based on height and weight. Your health care provider can help determine your BMI and help you achieve or maintain a healthy weight. Get regular exercise Get regular exercise. This is one of the most important things you can do for your health. Most adults should: Exercise for at least 150 minutes each week. The exercise should increase your heart rate and make you sweat (moderate-intensity exercise). Do strengthening exercises at least twice a week. This is in  addition to the moderate-intensity exercise. Spend less time sitting. Even light physical activity can be beneficial. Watch cholesterol and blood lipids Have your blood tested for lipids and cholesterol at 71 years of age, then have this test every 5 years. You may need to have your cholesterol levels checked more often if: Your lipid or cholesterol levels are high. You are older than 71 years of age. You are at high risk for heart disease. What should I know about cancer screening? Many types of cancers can be detected early and may often be prevented. Depending on your health history and family history, you may need to have cancer screening at various ages. This may include screening for: Colorectal cancer. Prostate cancer. Skin cancer. Lung cancer. What should I know about heart disease, diabetes, and high blood pressure? Blood pressure and heart disease High blood pressure causes heart disease and increases the risk of stroke. This is more likely to develop in people who have high blood pressure readings or are overweight. Talk with your health care provider about your target blood pressure readings. Have your blood pressure checked: Every 3-5 years if you are 15-44 years of age. Every year if you are 58 years old or older. If you are between the ages of 14 and 12 and are a current or former smoker, ask your health care provider if you should have a one-time screening for abdominal aortic aneurysm (AAA). Diabetes  Have regular diabetes screenings. This checks your fasting blood sugar level. Have the screening done: Once every three years after age 79 if you are at a normal weight and have a low risk for diabetes. More often and at a younger age if you are overweight or have a high risk for diabetes. What should I know about preventing infection? Hepatitis B If you have a higher risk for hepatitis B, you should be screened for this virus. Talk with your health care provider to find out  if you are at risk for hepatitis B infection. Hepatitis C Blood testing is recommended for: Everyone born from [birth year] through 1965. Anyone with known risk factors for hepatitis C. Sexually transmitted infections (STIs) You should be screened each year for STIs, including gonorrhea and chlamydia, if: You are sexually active and are younger than 71 years of age. You are older than 71 years of age and your health care provider tells you that you are at risk for this type of infection. Your sexual activity has changed since you were last screened, and you are at increased risk for chlamydia or gonorrhea. Ask your health care provider if you are at risk. Ask your health care provider about whether you are at high risk for HIV. Your health care provider may recommend a prescription medicine to help prevent HIV infection. If you choose to take medicine to prevent HIV, you should first get tested for HIV. You should then be tested every 3 months for as long as you are taking the medicine. Follow these instructions at home: Alcohol use Do not drink alcohol if your health care provider tells you not to drink. If you drink alcohol: Limit how much you have to 0-2 drinks a day. Know how much alcohol is in your drink. In the [place] one drink equals one 12 oz bottle of beer (355 mL), one 5 oz glass of wine (148 mL), or one 1 oz glass of hard liquor (44 mL). Lifestyle Do not use any products that contain nicotine or tobacco. These products include cigarettes, chewing tobacco, and vaping devices, such as e-cigarettes. If you need help quitting, ask your health care provider. Do not use street drugs. Do not share needles. Ask your health care provider for help if you need support or information about quitting drugs. General instructions Schedule regular health, dental, and eye exams. Stay current with your vaccines. Tell your health care provider if: You often feel depressed. You have ever been abused or do  not feel safe at home. Summary Adopting a healthy lifestyle and getting preventive care are important in promoting health and wellness. Follow your health care provider's instructions about healthy diet, exercising, and getting tested or screened for diseases. Follow your health care provider's instructions on monitoring your cholesterol and blood pressure. This information is not intended to replace advice given to you by your health care provider. Make sure you discuss any questions you have with your health care provider. Document Revised: 02/15/2021 Document Reviewed: 02/15/2021 Elsevier Patient Education  [place].

## 2021-10-20 DIAGNOSIS — M1712 Unilateral primary osteoarthritis, left knee: Secondary | ICD-10-CM | POA: Diagnosis not present

## 2021-10-26 DIAGNOSIS — M25662 Stiffness of left knee, not elsewhere classified: Secondary | ICD-10-CM | POA: Diagnosis not present

## 2021-10-26 DIAGNOSIS — R262 Difficulty in walking, not elsewhere classified: Secondary | ICD-10-CM | POA: Diagnosis not present

## 2021-10-26 DIAGNOSIS — M6281 Muscle weakness (generalized): Secondary | ICD-10-CM | POA: Diagnosis not present

## 2021-10-26 DIAGNOSIS — M1712 Unilateral primary osteoarthritis, left knee: Secondary | ICD-10-CM | POA: Diagnosis not present

## 2021-10-29 DIAGNOSIS — R262 Difficulty in walking, not elsewhere classified: Secondary | ICD-10-CM | POA: Diagnosis not present

## 2021-10-29 DIAGNOSIS — M6281 Muscle weakness (generalized): Secondary | ICD-10-CM | POA: Diagnosis not present

## 2021-10-29 DIAGNOSIS — M25662 Stiffness of left knee, not elsewhere classified: Secondary | ICD-10-CM | POA: Diagnosis not present

## 2021-10-29 DIAGNOSIS — M1712 Unilateral primary osteoarthritis, left knee: Secondary | ICD-10-CM | POA: Diagnosis not present

## 2021-11-03 DIAGNOSIS — M6281 Muscle weakness (generalized): Secondary | ICD-10-CM | POA: Diagnosis not present

## 2021-11-03 DIAGNOSIS — M1712 Unilateral primary osteoarthritis, left knee: Secondary | ICD-10-CM | POA: Diagnosis not present

## 2021-11-03 DIAGNOSIS — R262 Difficulty in walking, not elsewhere classified: Secondary | ICD-10-CM | POA: Diagnosis not present

## 2021-11-03 DIAGNOSIS — M25662 Stiffness of left knee, not elsewhere classified: Secondary | ICD-10-CM | POA: Diagnosis not present

## 2021-11-05 DIAGNOSIS — R262 Difficulty in walking, not elsewhere classified: Secondary | ICD-10-CM | POA: Diagnosis not present

## 2021-11-05 DIAGNOSIS — M25662 Stiffness of left knee, not elsewhere classified: Secondary | ICD-10-CM | POA: Diagnosis not present

## 2021-11-05 DIAGNOSIS — M1712 Unilateral primary osteoarthritis, left knee: Secondary | ICD-10-CM | POA: Diagnosis not present

## 2021-11-05 DIAGNOSIS — M6281 Muscle weakness (generalized): Secondary | ICD-10-CM | POA: Diagnosis not present

## 2021-11-08 DIAGNOSIS — M1712 Unilateral primary osteoarthritis, left knee: Secondary | ICD-10-CM | POA: Diagnosis not present

## 2021-11-08 DIAGNOSIS — R262 Difficulty in walking, not elsewhere classified: Secondary | ICD-10-CM | POA: Diagnosis not present

## 2021-11-08 DIAGNOSIS — M6281 Muscle weakness (generalized): Secondary | ICD-10-CM | POA: Diagnosis not present

## 2021-11-08 DIAGNOSIS — M25662 Stiffness of left knee, not elsewhere classified: Secondary | ICD-10-CM | POA: Diagnosis not present

## 2021-11-10 DIAGNOSIS — M1712 Unilateral primary osteoarthritis, left knee: Secondary | ICD-10-CM | POA: Diagnosis not present

## 2021-11-16 ENCOUNTER — Telehealth: Payer: Self-pay

## 2021-11-16 DIAGNOSIS — C61 Malignant neoplasm of prostate: Secondary | ICD-10-CM | POA: Diagnosis not present

## 2021-11-16 NOTE — Telephone Encounter (Signed)
error 

## 2021-11-24 DIAGNOSIS — R311 Benign essential microscopic hematuria: Secondary | ICD-10-CM | POA: Diagnosis not present

## 2021-11-24 DIAGNOSIS — C61 Malignant neoplasm of prostate: Secondary | ICD-10-CM | POA: Diagnosis not present

## 2021-12-21 ENCOUNTER — Telehealth: Payer: Self-pay | Admitting: Internal Medicine

## 2021-12-21 NOTE — Telephone Encounter (Signed)
Pt advised provider would not be back in office until Monday. Please advise and would it be appropriate for pt to have drawn before June  ?

## 2021-12-21 NOTE — Telephone Encounter (Signed)
LVM for pt to call the office.

## 2021-12-21 NOTE — Telephone Encounter (Signed)
Patient called in regard to blood work orders.  ?Patient is requesting orders for potassium, patient is taking a med that could raise the potassium levels in the blood. ? ?Patient wants to have checked. ? ? ?

## 2021-12-22 DIAGNOSIS — M1712 Unilateral primary osteoarthritis, left knee: Secondary | ICD-10-CM | POA: Diagnosis not present

## 2021-12-27 NOTE — Telephone Encounter (Signed)
Patient aware.

## 2021-12-27 NOTE — Telephone Encounter (Signed)
LVM for pt to call the office.

## 2021-12-28 DIAGNOSIS — Z1159 Encounter for screening for other viral diseases: Secondary | ICD-10-CM | POA: Diagnosis not present

## 2021-12-28 DIAGNOSIS — Z8546 Personal history of malignant neoplasm of prostate: Secondary | ICD-10-CM | POA: Diagnosis not present

## 2021-12-28 DIAGNOSIS — I10 Essential (primary) hypertension: Secondary | ICD-10-CM | POA: Diagnosis not present

## 2021-12-29 LAB — URINALYSIS
Bilirubin, UA: NEGATIVE
Glucose, UA: NEGATIVE
Ketones, UA: NEGATIVE
Leukocytes,UA: NEGATIVE
Nitrite, UA: NEGATIVE
Protein,UA: NEGATIVE
Specific Gravity, UA: 1.015 (ref 1.005–1.030)
Urobilinogen, Ur: 0.2 mg/dL (ref 0.2–1.0)
pH, UA: 7.5 (ref 5.0–7.5)

## 2021-12-29 LAB — CMP14+EGFR
ALT: 11 IU/L (ref 0–44)
AST: 20 IU/L (ref 0–40)
Albumin/Globulin Ratio: 1.8 (ref 1.2–2.2)
Albumin: 4.2 g/dL (ref 3.8–4.8)
Alkaline Phosphatase: 89 IU/L (ref 44–121)
BUN/Creatinine Ratio: 10 (ref 10–24)
BUN: 9 mg/dL (ref 8–27)
Bilirubin Total: 0.7 mg/dL (ref 0.0–1.2)
CO2: 25 mmol/L (ref 20–29)
Calcium: 9.6 mg/dL (ref 8.6–10.2)
Chloride: 104 mmol/L (ref 96–106)
Creatinine, Ser: 0.9 mg/dL (ref 0.76–1.27)
Globulin, Total: 2.4 g/dL (ref 1.5–4.5)
Glucose: 76 mg/dL (ref 70–99)
Potassium: 4.7 mmol/L (ref 3.5–5.2)
Sodium: 145 mmol/L — ABNORMAL HIGH (ref 134–144)
Total Protein: 6.6 g/dL (ref 6.0–8.5)
eGFR: 92 mL/min/{1.73_m2} (ref >59)

## 2021-12-29 LAB — LIPID PANEL
Chol/HDL Ratio: 3.1 ratio (ref 0.0–5.0)
Cholesterol, Total: 142 mg/dL (ref 100–199)
HDL: 46 mg/dL (ref >39)
LDL Chol Calc (NIH): 79 mg/dL (ref 0–99)
Triglycerides: 91 mg/dL (ref 0–149)
VLDL Cholesterol Cal: 17 mg/dL (ref 5–40)

## 2021-12-29 LAB — PSA: Prostate Specific Ag, Serum: <0.1 ng/mL (ref 0.0–4.0)

## 2021-12-29 LAB — HEPATITIS C ANTIBODY: Hep C Virus Ab: NONREACTIVE

## 2022-01-03 ENCOUNTER — Telehealth: Payer: Self-pay

## 2022-01-03 NOTE — Telephone Encounter (Signed)
Patient calling about his lab results.  ?

## 2022-01-03 NOTE — Telephone Encounter (Signed)
Pt advised with verbal understanding  °

## 2022-01-20 ENCOUNTER — Encounter: Payer: Self-pay | Admitting: Cardiovascular Disease

## 2022-01-20 ENCOUNTER — Ambulatory Visit: Payer: Medicare HMO | Admitting: Cardiovascular Disease

## 2022-01-20 VITALS — BP 120/76 | HR 56 | Ht 66.0 in | Wt 170.2 lb

## 2022-01-20 DIAGNOSIS — I341 Nonrheumatic mitral (valve) prolapse: Secondary | ICD-10-CM

## 2022-01-20 DIAGNOSIS — R002 Palpitations: Secondary | ICD-10-CM

## 2022-01-20 DIAGNOSIS — I10 Essential (primary) hypertension: Secondary | ICD-10-CM

## 2022-01-20 NOTE — Progress Notes (Signed)
Patient ID: WAH SABIC, male   DOB: [birth year], 71 y.o.   MRN: [id number] ?  ? ?Cardiology Office Note   ? ?Date:  01/20/2022  ? ?ID:  Kenneth Booker, DOB June 26, 1951, MRN [id number] ? ?PCP:  Lindell Spar, MD  ?Cardiologist:   Sanda Klein, MD  ? ?Chief Complaint  ?Patient presents with  ? Hypertension  ? Palpitations  ?   ?  ? ? ? ?History of Present Illness:  ?Kenneth Booker is a 71 y.o. male with hypertension and mild mitral valve prolapse and palpitations, prostate cancer status post prostatectomy. ? ?He underwent successful left total knee replacement about 3 months ago and has made a very rapid recovery.  Following that he has improved his diet.  He has cut back on sweets, hush puppies and sodas.  He is eating a lot of bananas and drinking beet juice.  He has lost a few pounds.  He is exercising again riding his bike and works a lot in his garden.  His blood pressure has improved substantially.  His palpitations have also decreased in frequency.  He denies any angina or dyspnea at rest or with activity and does not have lower extremity edema, claudication or any focal neurological complaints.  He has not had dizziness or syncope.  His typical blood pressure at home is in the 100-120/60-70 range. ? ?His primary care provider check labs and his lipid profile is also quite favorable with an HDL of 46 and LDL of 79, normal triglycerides, normal glucose and hemoglobin A1c.  Creatinine 0.9, potassium 4.7.  His sodium is borderline high at 145. ? ? ?Past Medical History:  ?Diagnosis Date  ? Cancer [facility])   ? Cartilage tear 2009  ? right knee  ? PONV (postoperative nausea and vomiting)   ? Systemic hypertension   ? ? ?Past Surgical History:  ?Procedure Laterality Date  ? CARDIOVASCULAR STRESS TEST  02/20/2002  ? Negative adequate Bruce protocal exercise stress test, there is scintigraphic evidence of apical thinning with normal dynamic images EF 60% calculated by QGS  ? CATARACT EXTRACTION W/PHACO Right  05/16/2013  ? Procedure: CATARACT EXTRACTION PHACO AND INTRAOCULAR LENS PLACEMENT (IOC);  Surgeon: Tonny Branch, MD;  Location: [facility];  Service: Ophthalmology;  Laterality: Right;  CDE:5.25  ? CATARACT EXTRACTION W/PHACO Left 06/03/2013  ? Procedure: CATARACT EXTRACTION PHACO AND INTRAOCULAR LENS PLACEMENT (IOC);  Surgeon: Tonny Branch, MD;  Location: [facility];  Service: Ophthalmology;  Laterality: Left;  CDE: 6.26  ? JOINT REPLACEMENT Left 09/23/2021  ? Done by Dr Percell Miller  ? KNEE CARTILAGE SURGERY Right 2007?  ? RENAL DUPLEX  02/20/2003  ? Bilateral renal arteries demonstrate normal valveswith no hemodynamically significance. Bilateral kidneysare symmetrical in shape with no obvious abnormality visualized.  ? ROBOT ASSISTED LAPAROSCOPIC RADICAL PROSTATECTOMY N/A 11/11/2019  ? Procedure: XI ROBOTIC ASSISTED LAPAROSCOPIC RADICAL PROSTATECTOMY LEVEL 1;  Surgeon: Raynelle Bring, MD;  Location: [facility];  Service: Urology;  Laterality: N/A;  ? TRANSTHORACIC ECHOCARDIOGRAM  03/20/2012  ? EF >55%, suggestive of borderline impaired LV relaxation with normal tissue doppler, mild MR, mild TR.   ? ? ?Outpatient Medications Prior to Visit  ?Medication Sig Dispense Refill  ? irbesartan-hydrochlorothiazide (AVALIDE) 150-12.5 MG tablet TAKE 1 TABLET BY MOUTH EVERY DAY FOR BLOOD PRESSURE 90 tablet 3  ? ?No facility-administered medications prior to visit.  ?  ? ?Allergies:   Norvasc [amlodipine besylate] and Tarka [trandolapril-verapamil hcl er]  ? ?Social History  ? ?Socioeconomic History  ?  Marital status: Married  ?  Spouse name: Not on file  ? Number of children: Not on file  ? Years of education: Not on file  ? Highest education level: Not on file  ?Occupational History  ? Not on file  ?Tobacco Use  ? Smoking status: Never  ? Smokeless tobacco: Never  ?Vaping Use  ? Vaping Use: Never used  ?Substance and Sexual Activity  ? Alcohol use: No  ? Drug use: No  ? Sexual activity: Not Currently  ?Other Topics Concern  ? Not on file  ?Social  History Narrative  ? Not on file  ? ?Social Determinants of Health  ? ?Financial Resource Strain: Low Risk   ? Difficulty of Paying Living Expenses: Not hard at all  ?Food Insecurity: No Food Insecurity  ? Worried About Charity fundraiser in the Last Year: Never true  ? Ran Out of Food in the Last Year: Never true  ?Transportation Needs: No Transportation Needs  ? Lack of Transportation (Medical): No  ? Lack of Transportation (Non-Medical): No  ?Physical Activity: Inactive  ? Days of Exercise per Week: 0 days  ? Minutes of Exercise per Session: 0 min  ?Stress: No Stress Concern Present  ? Feeling of Stress : Not at all  ?Social Connections: Socially Integrated  ? Frequency of Communication with Friends and Family: More than three times a week  ? Frequency of Social Gatherings with Friends and Family: Once a week  ? Attends Religious Services: More than 4 times per year  ? Active Member of Clubs or Organizations: Yes  ? Attends Archivist Meetings: Never  ? Marital Status: Married  ?  ? ?Family History:  The patient's family history includes Cancer in his brother, brother, and father; Cancer (age of onset: 35) in his sister; Diabetes in his paternal grandmother; Heart attack in his mother; Hypertension in his sister and sister.  ? ?ROS:   ?Please see the history of present illness.    ?ROS  ?All other systems are reviewed and are negative. ? ?PHYSICAL EXAM:   ?VS:  BP 120/76   Pulse (!) 56   Ht '5\' 6"'$  (1.676 m)   Wt 170 lb 3.2 oz (77.2 kg)   SpO2 97%   BMI 27.47 kg/m?    ? ? ? ?General: Alert, oriented x3, no distress, appears physically fit and younger than stated age, minimally overweight ?Head: no evidence of trauma, PERRL, EOMI, no exophtalmos or lid lag, no myxedema, no xanthelasma; normal ears, nose and oropharynx ?Neck: normal jugular venous pulsations and no hepatojugular reflux; brisk carotid pulses without delay and no carotid bruits ?Chest: clear to auscultation, no signs of consolidation by  percussion or palpation, normal fremitus, symmetrical and full respiratory excursions ?Cardiovascular: normal position and quality of the apical impulse, regular rhythm, normal first and second heart sounds, no murmurs, rubs or gallops ?Abdomen: no tenderness or distention, no masses by palpation, no abnormal pulsatility or arterial bruits, normal bowel sounds, no hepatosplenomegaly ?Extremities: no clubbing, cyanosis or edema; 2+ radial, ulnar and brachial pulses bilaterally; 2+ right femoral, posterior tibial and dorsalis pedis pulses; 2+ left femoral, posterior tibial and dorsalis pedis pulses; no subclavian or femoral bruits ?Neurological: grossly nonfocal ?Psych: Normal mood and affect ? ? ? ? ? ?Wt Readings from Last 3 Encounters:  ?01/20/22 170 lb 3.2 oz (77.2 kg)  ?09/29/21 174 lb (78.9 kg)  ?07/02/21 174 lb (78.9 kg)  ?  ? ? ?Studies/Labs Reviewed:  ? ?EKG:  EKG is ordered today and personally reviewed, shows sinus bradycardia and is otherwise a completely normal tracing.  QTc 409 ms. ? ?Lipid Panel  ?   ?Component Value Date/Time  ? CHOL 142 12/28/2021 0816  ? TRIG 91 12/28/2021 0816  ? HDL 46 12/28/2021 0816  ? CHOLHDL 3.1 12/28/2021 0816  ? [place] 79 12/28/2021 0816  ? ?BMET ?   ?Component Value Date/Time  ? NA 145 (H) 12/28/2021 0816  ? K 4.7 12/28/2021 0816  ? CL 104 12/28/2021 0816  ? CO2 25 12/28/2021 0816  ? GLUCOSE 76 12/28/2021 0816  ? GLUCOSE 161 (H) 11/12/2019 0421  ? BUN 9 12/28/2021 0816  ? CREATININE 0.90 12/28/2021 0816  ? CALCIUM 9.6 12/28/2021 0816  ? GFRNONAA 52 (L) 11/12/2019 0421  ? GFRAA >60 11/12/2019 0421  ? ?ASSESSMENT:   ? ?1. Heart palpitations   ?2. Essential hypertension   ?3. MVP (mitral valve prolapse)   ? ? ? ? ? ?PLAN:  ?In order of problems listed above: ? ?Palpitations: These have decreased in frequency and do not bother him much anymore.  Suspect he may have been related to the pain from his knee problems or anxiety related to the upcoming surgery.  His adherence to a  ONEOK is probably also helping. ?HTN: Excellent control. ?MVP: He has a distant midsystolic click.  He does not have a murmur. ?HLP: Excellent lipid profile.  This has improved substantially just with cha

## 2022-01-20 NOTE — Patient Instructions (Addendum)
Medication Instructions:  ?No changes ?*If you need a refill on your cardiac medications before your next appointment, please call your pharmacy* ? ? ?Lab Work: ?None ordered ?If you have labs (blood work) drawn today and your tests are completely normal, you will receive your results only by: ?MyChart Message (if you have MyChart) OR ?A paper copy in the mail ?If you have any lab test that is abnormal or we need to change your treatment, we will call you to review the results. ? ? ?Testing/Procedures: ?None ordered ? ? ?Follow-Up: ?At [facility], you and your health needs are our priority.  As part of our continuing mission to provide you with exceptional heart care, we have created designated Provider Care Teams.  These Care Teams include your primary Cardiologist (physician) and Advanced Practice Providers (APPs -  Physician Assistants and Nurse Practitioners) who all work together to provide you with the care you need, when you need it. ? ?We recommend signing up for the patient portal called "MyChart".  Sign up information is provided on this After Visit Summary.  MyChart is used to connect with patients for Virtual Visits (Telemedicine).  Patients are able to view lab/test results, encounter notes, upcoming appointments, etc.  Non-urgent messages can be sent to your provider as well.   ?To learn more about what you can do with MyChart, go to [email or web address].   ? ?Your next appointment:   ?12 month(s) ? ?The format for your next appointment:   ?In Person ? ?Provider:   ?Jyrah Blye, MD { ? ? ?Important Information About Sugar ? ? ? ? ? ? ?

## 2022-02-21 DIAGNOSIS — L700 Acne vulgaris: Secondary | ICD-10-CM | POA: Diagnosis not present

## 2022-02-21 DIAGNOSIS — L570 Actinic keratosis: Secondary | ICD-10-CM | POA: Diagnosis not present

## 2022-02-21 DIAGNOSIS — L578 Other skin changes due to chronic exposure to nonionizing radiation: Secondary | ICD-10-CM | POA: Diagnosis not present

## 2022-02-21 DIAGNOSIS — Z872 Personal history of diseases of the skin and subcutaneous tissue: Secondary | ICD-10-CM | POA: Diagnosis not present

## 2022-04-01 ENCOUNTER — Encounter: Payer: Self-pay | Admitting: Internal Medicine

## 2022-04-01 ENCOUNTER — Ambulatory Visit (INDEPENDENT_AMBULATORY_CARE_PROVIDER_SITE_OTHER): Payer: Medicare HMO | Admitting: Internal Medicine

## 2022-04-01 VITALS — BP 138/88 | HR 66 | Resp 18 | Ht 66.0 in | Wt 169.6 lb

## 2022-04-01 DIAGNOSIS — H6121 Impacted cerumen, right ear: Secondary | ICD-10-CM | POA: Insufficient documentation

## 2022-04-01 DIAGNOSIS — E559 Vitamin D deficiency, unspecified: Secondary | ICD-10-CM

## 2022-04-01 DIAGNOSIS — Z0000 Encounter for general adult medical examination without abnormal findings: Secondary | ICD-10-CM

## 2022-04-01 DIAGNOSIS — Z8546 Personal history of malignant neoplasm of prostate: Secondary | ICD-10-CM

## 2022-04-01 DIAGNOSIS — I10 Essential (primary) hypertension: Secondary | ICD-10-CM

## 2022-04-01 DIAGNOSIS — E782 Mixed hyperlipidemia: Secondary | ICD-10-CM | POA: Diagnosis not present

## 2022-04-01 NOTE — Assessment & Plan Note (Signed)
S/p total prostatectomy Followed by Dr Laverle Patter

## 2022-04-05 ENCOUNTER — Encounter: Payer: Self-pay | Admitting: *Deleted

## 2022-07-05 ENCOUNTER — Other Ambulatory Visit: Payer: Self-pay | Admitting: Cardiovascular Disease

## 2022-07-13 DIAGNOSIS — Z936 Other artificial openings of urinary tract status: Secondary | ICD-10-CM | POA: Diagnosis not present

## 2022-07-18 ENCOUNTER — Encounter: Payer: Self-pay | Admitting: Internal Medicine

## 2022-07-18 ENCOUNTER — Ambulatory Visit (INDEPENDENT_AMBULATORY_CARE_PROVIDER_SITE_OTHER): Payer: Medicare HMO | Admitting: Internal Medicine

## 2022-07-18 VITALS — BP 152/90 | HR 68 | Resp 16 | Ht 66.5 in | Wt 170.4 lb

## 2022-07-18 DIAGNOSIS — R002 Palpitations: Secondary | ICD-10-CM | POA: Diagnosis not present

## 2022-07-18 DIAGNOSIS — Z23 Encounter for immunization: Secondary | ICD-10-CM | POA: Diagnosis not present

## 2022-07-18 DIAGNOSIS — I10 Essential (primary) hypertension: Secondary | ICD-10-CM

## 2022-07-18 MED ORDER — IRBESARTAN-HYDROCHLOROTHIAZIDE 150-12.5 MG PO TABS: 1.5000 | ORAL_TABLET | Freq: Every day | ORAL | 3 refills | Status: DC

## 2022-07-18 NOTE — Progress Notes (Signed)
Established Patient Office Visit  Subjective:  Patient ID: Kenneth Booker, male    DOB: November 30, 1950  Age: 71 y.o. MRN: [id number]  CC:  Chief Complaint  Patient presents with   Hypertension    Patient has been checking bp at home it is high sometimes and then it is normal sometimes has been on the same bp meds for 20 years    HPI Kenneth Booker is a 71 y.o. male with past medical history of HTN, prostate cancer s/p total prostatectomy and OA of knee who presents for f/u of his chronic medical conditions.  HTN: BP is elevated today. He has brought his BP readings from home, which are elevated at times as well. Takes medications regularly. Patient denies headache, dizziness, chest pain, dyspnea. Does report intermittent palpitations.  He has had cardiology evaluation for it, which was unremarkable.  He reports that he used to do bicycling, which improved his BP.  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    Past Medical History:  Diagnosis Date   Cancer [facility]    Cartilage tear 2009   right knee   PONV (postoperative nausea and vomiting)    Systemic hypertension     Past Surgical History:  Procedure Laterality Date   CARDIOVASCULAR STRESS TEST  02/20/2002   Negative adequate Bruce protocal exercise stress test, there is scintigraphic evidence of apical thinning with normal dynamic images EF 60% calculated by QGS   CATARACT EXTRACTION W/PHACO Right 05/16/2013   Procedure: CATARACT EXTRACTION PHACO AND INTRAOCULAR LENS PLACEMENT ([place]);  Surgeon: Tonny Branch, MD;  Location: [facility];  Service: Ophthalmology;  Laterality: Right;  CDE:5.25   CATARACT EXTRACTION W/PHACO Left 06/03/2013   Procedure: CATARACT EXTRACTION PHACO AND INTRAOCULAR LENS PLACEMENT (IOC);  Surgeon: Tonny Branch, MD;  Location: [facility];  Service: Ophthalmology;  Laterality: Left;  CDE: 6.26   JOINT REPLACEMENT Left 09/23/2021   Done by Dr Percell Miller   KNEE CARTILAGE  SURGERY Right 2007?   RENAL DUPLEX  02/20/2003   Bilateral renal arteries demonstrate normal valveswith no hemodynamically significance. Bilateral kidneysare symmetrical in shape with no obvious abnormality visualized.   ROBOT ASSISTED LAPAROSCOPIC RADICAL PROSTATECTOMY N/A 11/11/2019   Procedure: XI ROBOTIC ASSISTED LAPAROSCOPIC RADICAL PROSTATECTOMY LEVEL 1;  Surgeon: Raynelle Bring, MD;  Location: [facility];  Service: Urology;  Laterality: N/A;   TRANSTHORACIC ECHOCARDIOGRAM  03/20/2012   EF >55%, suggestive of borderline impaired LV relaxation with normal tissue doppler, mild MR, mild TR.     Family History  Problem Relation Age of Onset   Heart attack Mother    Cancer Father        Lung cancer   Hypertension Sister    Cancer Sister 10       Breast cancer   Cancer Brother        Colon cancer   Diabetes Paternal Grandmother    Hypertension Sister    Cancer Brother        Stomach cancer    Social History   Socioeconomic History   Marital status: Married    Spouse name: Not on file   Number of children: Not on file   Years of education: Not on file   Highest education level: Not on file  Occupational History   Not on file  Tobacco Use   Smoking status: Never   Smokeless tobacco: Never  Vaping Use   Vaping Use: Never used  Substance and Sexual Activity   Alcohol use: No   Drug use: No   Sexual activity: Not Currently  Other Topics Concern   Not on file  Social History Narrative   Not on file   Social Determinants of Health   Financial Resource Strain: Low Risk  (10/19/2021)   Overall Financial Resource Strain (CARDIA)    Difficulty of Paying Living Expenses: Not hard at all  Food Insecurity: No Food Insecurity (10/19/2021)   Hunger Vital Sign    Worried About Running Out of Food in the Last Year: Never true    Ran Out of Food in the Last Year: Never true  Transportation Needs: No Transportation Needs (10/19/2021)   PRAPARE - Radiographer, therapeutic (Medical): No    Lack of Transportation (Non-Medical): No  Physical Activity: Inactive (10/19/2021)   Exercise Vital Sign    Days of Exercise per Week: 0 days    Minutes of Exercise per Session: 0 min  Stress: No Stress Concern Present (10/19/2021)   [place]    Feeling of Stress : Not at all  Social Connections: [place] (10/19/2021)   Social Connection and Isolation Panel [NHANES]    Frequency of Communication with Friends and Family: More than three times a week    Frequency of Social Gatherings with Friends and Family: Once a week    Attends Religious Services: More than 4 times per year    Active Member of [employer] or Organizations: Yes    Attends Archivist Meetings: Never    Marital Status: Married  Human resources officer Violence: Not At Risk (10/19/2021)   Humiliation, Afraid, Rape, and Kick questionnaire    Fear of Current or Ex-Partner: No    Emotionally Abused: No    Physically Abused: No    Sexually Abused: No    Outpatient Medications Prior to Visit  Medication Sig Dispense Refill   irbesartan-hydrochlorothiazide (AVALIDE) 150-12.5 MG tablet TAKE 1 TABLET BY MOUTH EVERY DAY FOR BLOOD PRESSURE 90 tablet 3   No facility-administered medications prior to visit.    Allergies  Allergen Reactions   Norvasc [Amlodipine Besylate] Anxiety   Tarka [Trandolapril-Verapamil Hcl Er] Anxiety    ROS Review of Systems  Constitutional:  Negative for chills and fever.  HENT:  Negative for congestion and sore throat.   Eyes:  Negative for pain and discharge.  Respiratory:  Negative for cough and shortness of breath.   Cardiovascular:  Positive for palpitations. Negative for chest pain.  Gastrointestinal:  Negative for constipation, diarrhea, nausea and vomiting.  Endocrine: Negative for polydipsia and polyuria.  Genitourinary:  Negative for dysuria and hematuria.  Musculoskeletal:   Positive for arthralgias. Negative for neck pain and neck stiffness.  Skin:  Negative for rash.  Neurological:  Negative for dizziness, weakness, numbness and headaches.  Psychiatric/Behavioral:  Negative for agitation and behavioral problems.       Objective:    Physical Exam Vitals reviewed.  Constitutional:      General: He is not in acute distress.    Appearance: He is not diaphoretic.  HENT:     Head: Normocephalic and atraumatic.     Mouth/Throat:     Mouth: Mucous membranes are moist.  Eyes:     General: No scleral icterus.    Extraocular Movements: Extraocular movements intact.  Cardiovascular:     Rate and Rhythm: Normal rate and regular rhythm.     Pulses: Normal  pulses.     Heart sounds: Normal heart sounds. No murmur heard. Pulmonary:     Breath sounds: Normal breath sounds. No wheezing or rales.  Musculoskeletal:     Cervical back: Neck supple. No tenderness.     Right lower leg: No edema.     Left lower leg: No edema.     Comments: S/p left TKA, incision site C/D/I  Skin:    General: Skin is warm.     Findings: No rash.  Neurological:     General: No focal deficit present.     Mental Status: He is alert and oriented to person, place, and time.     Sensory: No sensory deficit.     Motor: No weakness.  Psychiatric:        Mood and Affect: Mood normal.        Behavior: Behavior normal.     BP (!) 152/90 (BP Location: Right Arm, Cuff Size: Normal)   Pulse 68   Resp 16   Ht 5' 6.5" (1.689 m)   Wt 170 lb 6.4 oz (77.3 kg)   SpO2 100%   BMI 27.09 kg/m  Wt Readings from Last 3 Encounters:  07/18/22 170 lb 6.4 oz (77.3 kg)  04/01/22 169 lb 9.6 oz (76.9 kg)  01/20/22 170 lb 3.2 oz (77.2 kg)    No results found for: "TSH" Lab Results  Component Value Date   WBC 3.2 (L) 11/07/2019   HGB 12.4 (L) 11/12/2019   HCT 35.5 (L) 11/12/2019   MCV 89.2 11/07/2019   PLT 283 11/07/2019   Lab Results  Component Value Date   NA 145 (H) 12/28/2021   K 4.7  12/28/2021   CO2 25 12/28/2021   GLUCOSE 76 12/28/2021   BUN 9 12/28/2021   CREATININE 0.90 12/28/2021   BILITOT 0.7 12/28/2021   ALKPHOS 89 12/28/2021   AST 20 12/28/2021   ALT 11 12/28/2021   PROT 6.6 12/28/2021   ALBUMIN 4.2 12/28/2021   CALCIUM 9.6 12/28/2021   ANIONGAP 7 11/12/2019   EGFR 92 12/28/2021   Lab Results  Component Value Date   CHOL 142 12/28/2021   Lab Results  Component Value Date   HDL 46 12/28/2021   Lab Results  Component Value Date   LDLCALC 79 12/28/2021   Lab Results  Component Value Date   TRIG 91 12/28/2021   Lab Results  Component Value Date   CHOLHDL 3.1 12/28/2021   No results found for: "HGBA1C"    Assessment & Plan:   Problem List Items Addressed This Visit       Cardiovascular and Mediastinum   Essential hypertension - Primary    BP Readings from Last 1 Encounters:  07/18/22 (!) 152/90  Uncontrolled with Irbesartan-HCTZ 150-12.5 mg QD, increased dose to 1.5 tablets QD Had allergic reaction to Amlodipine and other CCBs in the past Counseled for compliance with the medications Advised DASH diet and moderate exercise/walking, at least 150 mins/week      Relevant Medications   irbesartan-hydrochlorothiazide (AVALIDE) 150-12.5 MG tablet     Other   Heart palpitations    Followed by Cardiology Recent Echo was normal He has tried taking his antihypertensive at bedtime, which has improved palpitations.      Other Visit Diagnoses     Need for immunization against influenza       Relevant Orders   Flu Vaccine QUAD High Dose(Fluad) (Completed)       Meds ordered this encounter  Medications  irbesartan-hydrochlorothiazide (AVALIDE) 150-12.5 MG tablet    Sig: Take 1.5 tablets by mouth daily.    Dispense:  135 tablet    Refill:  3    Follow-up: No follow-ups on file.    Lindell Spar, MD

## 2022-07-18 NOTE — Patient Instructions (Signed)
Please start taking 1.5 tablets of Irbesartan-HCTZ.  Please continue to follow low salt diet and perform moderate exercise/walking at least 150 mins/week.

## 2022-07-18 NOTE — Assessment & Plan Note (Signed)
BP Readings from Last 1 Encounters:  07/18/22 (!) 152/90   Uncontrolled with Irbesartan-HCTZ 150-12.5 mg QD, increased dose to 1.5 tablets QD Had allergic reaction to Amlodipine and other CCBs in the past Counseled for compliance with the medications Advised DASH diet and moderate exercise/walking, at least 150 mins/week

## 2022-07-18 NOTE — Assessment & Plan Note (Signed)
Followed by Cardiology Recent Echo was normal He has tried taking his antihypertensive at bedtime, which has improved palpitations. 

## 2022-07-27 ENCOUNTER — Other Ambulatory Visit: Payer: Self-pay | Admitting: Internal Medicine

## 2022-07-27 ENCOUNTER — Telehealth: Payer: Self-pay

## 2022-07-27 DIAGNOSIS — I10 Essential (primary) hypertension: Secondary | ICD-10-CM

## 2022-07-27 NOTE — Telephone Encounter (Signed)
Spoke with patient advised of Dr Serita Grit message pt verbalized understanding

## 2022-07-27 NOTE — Telephone Encounter (Signed)
Patient called has not noticed any difference in blood pressure change, since medicine given last week. Please contact patient at [phone].

## 2022-07-27 NOTE — Telephone Encounter (Signed)
Please advise 

## 2022-08-04 ENCOUNTER — Telehealth: Payer: Self-pay

## 2022-08-04 NOTE — Telephone Encounter (Signed)
Patient called said Dr Posey Pronto gave him some medicine for his blood pressure irbesartan-hydrochlorothiazide (AVALIDE) 150-12.5 MG tablet [[id number]]  said did not do much good, said called last week and was told to take bid and today still not where blood pressure needs to be please contact patient. Blood pressure been running 140/85.   Please contact patient cell phone # [phone]

## 2022-08-04 NOTE — Telephone Encounter (Signed)
scheduled

## 2022-08-08 ENCOUNTER — Encounter: Payer: Self-pay | Admitting: Internal Medicine

## 2022-08-08 ENCOUNTER — Ambulatory Visit: Payer: Medicare HMO | Admitting: Internal Medicine

## 2022-08-08 ENCOUNTER — Ambulatory Visit (INDEPENDENT_AMBULATORY_CARE_PROVIDER_SITE_OTHER): Payer: Medicare HMO | Admitting: Internal Medicine

## 2022-08-08 VITALS — BP 150/92 | HR 55 | Resp 16 | Ht 66.0 in | Wt 174.0 lb

## 2022-08-08 DIAGNOSIS — R002 Palpitations: Secondary | ICD-10-CM | POA: Diagnosis not present

## 2022-08-08 DIAGNOSIS — E782 Mixed hyperlipidemia: Secondary | ICD-10-CM | POA: Diagnosis not present

## 2022-08-08 DIAGNOSIS — I341 Nonrheumatic mitral (valve) prolapse: Secondary | ICD-10-CM

## 2022-08-08 DIAGNOSIS — I10 Essential (primary) hypertension: Secondary | ICD-10-CM

## 2022-08-08 MED ORDER — HYDRALAZINE HCL 10 MG PO TABS: 10.0000 mg | ORAL_TABLET | Freq: Two times a day (BID) | ORAL | 2 refills | Status: DC

## 2022-08-08 MED ORDER — IRBESARTAN-HYDROCHLOROTHIAZIDE 150-12.5 MG PO TABS: 2.0000 | ORAL_TABLET | Freq: Every day | ORAL | 3 refills | Status: DC

## 2022-08-08 NOTE — Assessment & Plan Note (Signed)
BP Readings from Last 1 Encounters:  08/08/22 (!) 150/92   Uncontrolled with Irbesartan-HCTZ 150-12.5 mg QD, increased dose to 2 tablets QD - still uncontrolled Had allergic reaction to Amlodipine and other CCBs in the past Avoid beta-blocker due to HR in 50s Started Hydralazine 10 mg BID for now Counseled for compliance with the medications Advised DASH diet and moderate exercise/walking, at least 150 mins/week

## 2022-08-08 NOTE — Assessment & Plan Note (Signed)
Followed by Cardiology Recent Echo was normal He has tried taking his antihypertensive at bedtime, which has improved palpitations.

## 2022-08-08 NOTE — Assessment & Plan Note (Signed)
Reports h/o HLD Checked lipid profile Advised to follow DASH diet for now

## 2022-08-08 NOTE — Progress Notes (Signed)
Established Patient Office Visit  Subjective:  Patient ID: Kenneth Booker, male    DOB: Jul 13, 1951  Age: 71 y.o. MRN: [id number]  CC:  Chief Complaint  Patient presents with   Hypertension    Bp check pt has brought device from home     HPI Kenneth Booker is a 71 y.o. male with past medical history of HTN, prostate cancer s/p total prostatectomy and OA of knee who presents for f/u of his chronic medical conditions.  HTN: BP is elevated today. He has brought his BP readings from home, which are elevated at times as well. Takes medications regularly - Avapro 300-25 mg QD now. Patient denies headache, dizziness, chest pain, dyspnea. He has had cardiology evaluation for it, which was unremarkable.  He reports that he used to do bicycling, which improved his BP.  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  We checked his BP manually and with his home device, which showed comparable readings.    Past Medical History:  Diagnosis Date   Cancer [facility])    Cartilage tear 2009   right knee   PONV (postoperative nausea and vomiting)    Systemic hypertension     Past Surgical History:  Procedure Laterality Date   CARDIOVASCULAR STRESS TEST  02/20/2002   Negative adequate Bruce protocal exercise stress test, there is scintigraphic evidence of apical thinning with normal dynamic images EF 60% calculated by QGS   CATARACT EXTRACTION W/PHACO Right 05/16/2013   Procedure: CATARACT EXTRACTION PHACO AND INTRAOCULAR LENS PLACEMENT ([place]);  Surgeon: Tonny Branch, MD;  Location: [facility];  Service: Ophthalmology;  Laterality: Right;  CDE:5.25   CATARACT EXTRACTION W/PHACO Left 06/03/2013   Procedure: CATARACT EXTRACTION PHACO AND INTRAOCULAR LENS PLACEMENT ([facility]);  Surgeon: Tonny Branch, MD;  Location: [facility];  Service: Ophthalmology;  Laterality: Left;  CDE: 6.26   JOINT REPLACEMENT Left 09/23/2021   Done by Dr Percell Miller   KNEE CARTILAGE SURGERY Right 2007?    RENAL DUPLEX  02/20/2003   Bilateral renal arteries demonstrate normal valveswith no hemodynamically significance. Bilateral kidneysare symmetrical in shape with no obvious abnormality visualized.   ROBOT ASSISTED LAPAROSCOPIC RADICAL PROSTATECTOMY N/A 11/11/2019   Procedure: XI ROBOTIC ASSISTED LAPAROSCOPIC RADICAL PROSTATECTOMY LEVEL 1;  Surgeon: Raynelle Bring, MD;  Location: [facility];  Service: Urology;  Laterality: N/A;   TRANSTHORACIC ECHOCARDIOGRAM  03/20/2012   EF >55%, suggestive of borderline impaired LV relaxation with normal tissue doppler, mild MR, mild TR.     Family History  Problem Relation Age of Onset   Heart attack Mother    Cancer Father        Lung cancer   Hypertension Sister    Cancer Sister 44       Breast cancer   Cancer Brother        Colon cancer   Diabetes Paternal Grandmother    Hypertension Sister    Cancer Brother        Stomach cancer    Social History   Socioeconomic History   Marital status: Married    Spouse name: Not on file   Number of children: Not on file   Years of education: Not on file   Highest education level: Not on file  Occupational History   Not on file  Tobacco Use   Smoking status: Never   Smokeless tobacco: Never  Vaping Use   Vaping Use: Never used  Substance and  Sexual Activity   Alcohol use: No   Drug use: No   Sexual activity: Not Currently  Other Topics Concern   Not on file  Social History Narrative   Not on file   Social Determinants of Health   Financial Resource Strain: Low Risk  (10/19/2021)   Overall Financial Resource Strain (CARDIA)    Difficulty of Paying Living Expenses: Not hard at all  Food Insecurity: No Food Insecurity (10/19/2021)   Hunger Vital Sign    Worried About Running Out of Food in the Last Year: Never true    Ran Out of Food in the Last Year: Never true  Transportation Needs: No Transportation Needs (10/19/2021)   PRAPARE - Hydrologist (Medical): No     Lack of Transportation (Non-Medical): No  Physical Activity: Inactive (10/19/2021)   Exercise Vital Sign    Days of Exercise per Week: 0 days    Minutes of Exercise per Session: 0 min  Stress: No Stress Concern Present (10/19/2021)   [place]    Feeling of Stress : Not at all  Social Connections: [place] (10/19/2021)   Social Connection and Isolation Panel [NHANES]    Frequency of Communication with Friends and Family: More than three times a week    Frequency of Social Gatherings with Friends and Family: Once a week    Attends Religious Services: More than 4 times per year    Active Member of [employer] or Organizations: Yes    Attends Archivist Meetings: Never    Marital Status: Married  Human resources officer Violence: Not At Risk (10/19/2021)   Humiliation, Afraid, Rape, and Kick questionnaire    Fear of Current or Ex-Partner: No    Emotionally Abused: No    Physically Abused: No    Sexually Abused: No    Outpatient Medications Prior to Visit  Medication Sig Dispense Refill   irbesartan-hydrochlorothiazide (AVALIDE) 150-12.5 MG tablet Take 1.5 tablets by mouth daily. 135 tablet 3   No facility-administered medications prior to visit.    Allergies  Allergen Reactions   Norvasc [Amlodipine Besylate] Anxiety   Tarka [Trandolapril-Verapamil Hcl Er] Anxiety    ROS Review of Systems  Constitutional:  Negative for chills and fever.  HENT:  Negative for congestion and sore throat.   Eyes:  Negative for pain and discharge.  Respiratory:  Negative for cough and shortness of breath.   Cardiovascular:  Negative for chest pain and palpitations.  Gastrointestinal:  Negative for constipation, diarrhea, nausea and vomiting.  Endocrine: Negative for polydipsia and polyuria.  Genitourinary:  Negative for dysuria and hematuria.  Musculoskeletal:  Positive for arthralgias. Negative for neck pain and neck  stiffness.  Skin:  Negative for rash.  Neurological:  Negative for dizziness, weakness, numbness and headaches.  Psychiatric/Behavioral:  Negative for agitation and behavioral problems.       Objective:    Physical Exam Vitals reviewed.  Constitutional:      General: He is not in acute distress.    Appearance: He is not diaphoretic.  HENT:     Head: Normocephalic and atraumatic.     Mouth/Throat:     Mouth: Mucous membranes are moist.  Eyes:     General: No scleral icterus.    Extraocular Movements: Extraocular movements intact.  Cardiovascular:     Rate and Rhythm: Normal rate and regular rhythm.     Pulses: Normal pulses.     Heart sounds:  Normal heart sounds. No murmur heard. Pulmonary:     Breath sounds: Normal breath sounds. No wheezing or rales.  Musculoskeletal:     Cervical back: Neck supple. No tenderness.     Right lower leg: No edema.     Left lower leg: No edema.     Comments: S/p left TKA, incision site C/D/I  Skin:    General: Skin is warm.     Findings: No rash.  Neurological:     General: No focal deficit present.     Mental Status: He is alert and oriented to person, place, and time.     Sensory: No sensory deficit.     Motor: No weakness.  Psychiatric:        Mood and Affect: Mood normal.        Behavior: Behavior normal.     BP (!) 150/92 (BP Location: Right Arm, Cuff Size: Normal)   Pulse (!) 55   Resp 16   Ht _0  (1.676 m)   Wt 174 lb (78.9 kg)   SpO2 100%   BMI 28.08 kg/m  Wt Readings from Last 3 Encounters:  08/08/22 174 lb (78.9 kg)  07/18/22 170 lb 6.4 oz (77.3 kg)  04/01/22 169 lb 9.6 oz (76.9 kg)    No results found for: "TSH" Lab Results  Component Value Date   WBC 3.2 (L) 11/07/2019   HGB 12.4 (L) 11/12/2019   HCT 35.5 (L) 11/12/2019   MCV 89.2 11/07/2019   PLT 283 11/07/2019   Lab Results  Component Value Date   NA 145 (H) 12/28/2021   K 4.7 12/28/2021   CO2 25 12/28/2021   GLUCOSE 76 12/28/2021   BUN 9  12/28/2021   CREATININE 0.90 12/28/2021   BILITOT 0.7 12/28/2021   ALKPHOS 89 12/28/2021   AST 20 12/28/2021   ALT 11 12/28/2021   PROT 6.6 12/28/2021   ALBUMIN 4.2 12/28/2021   CALCIUM 9.6 12/28/2021   ANIONGAP 7 11/12/2019   EGFR 92 12/28/2021   Lab Results  Component Value Date   CHOL 142 12/28/2021   Lab Results  Component Value Date   HDL 46 12/28/2021   Lab Results  Component Value Date   LDLCALC 79 12/28/2021   Lab Results  Component Value Date   TRIG 91 12/28/2021   Lab Results  Component Value Date   CHOLHDL 3.1 12/28/2021   No results found for: "HGBA1C"    Assessment & Plan:   Problem List Items Addressed This Visit       Cardiovascular and Mediastinum   MVP (mitral valve prolapse)    Asymptomatic Has had Cardiology evaluation      Relevant Medications   hydrALAZINE (APRESOLINE) 10 MG tablet   irbesartan-hydrochlorothiazide (AVALIDE) 150-12.5 MG tablet   Essential hypertension - Primary    BP Readings from Last 1 Encounters:  08/08/22 (!) 150/92  Uncontrolled with Irbesartan-HCTZ 150-12.5 mg QD, increased dose to 2 tablets QD - still uncontrolled Had allergic reaction to Amlodipine and other CCBs in the past Avoid beta-blocker due to HR in 50s Started Hydralazine 10 mg BID for now Counseled for compliance with the medications Advised DASH diet and moderate exercise/walking, at least 150 mins/week      Relevant Medications   hydrALAZINE (APRESOLINE) 10 MG tablet   irbesartan-hydrochlorothiazide (AVALIDE) 150-12.5 MG tablet     Other   Heart palpitations    Followed by Cardiology Recent Echo was normal He has tried taking his antihypertensive at bedtime, which  has improved palpitations.      Mixed hyperlipidemia    Reports h/o HLD Checked lipid profile Advised to follow DASH diet for now      Relevant Medications   hydrALAZINE (APRESOLINE) 10 MG tablet   irbesartan-hydrochlorothiazide (AVALIDE) 150-12.5 MG tablet    Meds  ordered this encounter  Medications   hydrALAZINE (APRESOLINE) 10 MG tablet    Sig: Take 1 tablet (10 mg total) by mouth 2 (two) times daily.    Dispense:  60 tablet    Refill:  2   irbesartan-hydrochlorothiazide (AVALIDE) 150-12.5 MG tablet    Sig: Take 2 tablets by mouth daily.    Dispense:  180 tablet    Refill:  3    Dose change    Follow-up: Return if symptoms worsen or fail to improve.    Lindell Spar, MD

## 2022-08-08 NOTE — Patient Instructions (Addendum)
Please start taking Hydralazine as prescribed.  Please continue other taking medications as prescribed.  Please continue to follow DASH diet and perform moderate exercise/walking at least 150 mins/week.

## 2022-08-08 NOTE — Assessment & Plan Note (Signed)
Asymptomatic Has had Cardiology evaluation

## 2022-08-18 ENCOUNTER — Telehealth: Payer: Self-pay | Admitting: Internal Medicine

## 2022-08-18 NOTE — Telephone Encounter (Signed)
Patient called in regard to   irbesartan-hydrochlorothiazide (AVALIDE) 150-12.5 MG tablet   hydrALAZINE (APRESOLINE) 10 MG tablet  Pt states that medication is making him sick / dizzy   Patient wants a call back in regard.

## 2022-09-26 DIAGNOSIS — R7301 Impaired fasting glucose: Secondary | ICD-10-CM | POA: Diagnosis not present

## 2022-09-26 DIAGNOSIS — E559 Vitamin D deficiency, unspecified: Secondary | ICD-10-CM | POA: Diagnosis not present

## 2022-09-26 DIAGNOSIS — Z0000 Encounter for general adult medical examination without abnormal findings: Secondary | ICD-10-CM | POA: Diagnosis not present

## 2022-09-26 DIAGNOSIS — I10 Essential (primary) hypertension: Secondary | ICD-10-CM | POA: Diagnosis not present

## 2022-09-26 DIAGNOSIS — E782 Mixed hyperlipidemia: Secondary | ICD-10-CM | POA: Diagnosis not present

## 2022-09-27 LAB — CBC WITH DIFFERENTIAL/PLATELET
Basophils Absolute: 0 10*3/uL (ref 0.0–0.2)
Basos: 1 %
EOS (ABSOLUTE): 0.1 10*3/uL (ref 0.0–0.4)
Eos: 2 %
Hematocrit: 37.6 % (ref 37.5–51.0)
Hemoglobin: 13.2 g/dL (ref 13.0–17.7)
Immature Grans (Abs): 0 10*3/uL (ref 0.0–0.1)
Immature Granulocytes: 0 %
Lymphocytes Absolute: 1 10*3/uL (ref 0.7–3.1)
Lymphs: 36 %
MCH: 30.7 pg (ref 26.6–33.0)
MCHC: 35.1 g/dL (ref 31.5–35.7)
MCV: 87 fL (ref 79–97)
Monocytes Absolute: 0.4 10*3/uL (ref 0.1–0.9)
Monocytes: 14 %
Neutrophils Absolute: 1.3 10*3/uL — ABNORMAL LOW (ref 1.4–7.0)
Neutrophils: 47 %
Platelets: 273 10*3/uL (ref 150–450)
RBC: 4.3 x10E6/uL (ref 4.14–5.80)
RDW: 15.9 % — ABNORMAL HIGH (ref 11.6–15.4)
WBC: 2.8 10*3/uL — ABNORMAL LOW (ref 3.4–10.8)

## 2022-09-27 LAB — CMP14+EGFR
ALT: 11 IU/L (ref 0–44)
AST: 15 IU/L (ref 0–40)
Albumin/Globulin Ratio: 1.7 (ref 1.2–2.2)
Albumin: 3.8 g/dL (ref 3.8–4.8)
Alkaline Phosphatase: 64 IU/L (ref 44–121)
BUN/Creatinine Ratio: 14 (ref 10–24)
BUN: 14 mg/dL (ref 8–27)
Bilirubin Total: 0.7 mg/dL (ref 0.0–1.2)
CO2: 24 mmol/L (ref 20–29)
Calcium: 9.1 mg/dL (ref 8.6–10.2)
Chloride: 104 mmol/L (ref 96–106)
Creatinine, Ser: 1 mg/dL (ref 0.76–1.27)
Globulin, Total: 2.3 g/dL (ref 1.5–4.5)
Glucose: 96 mg/dL (ref 70–99)
Potassium: 4.9 mmol/L (ref 3.5–5.2)
Sodium: 140 mmol/L (ref 134–144)
Total Protein: 6.1 g/dL (ref 6.0–8.5)
eGFR: 80 mL/min/{1.73_m2} (ref >59)

## 2022-09-27 LAB — HEMOGLOBIN A1C
Est. average glucose Bld gHb Est-mCnc: 105 mg/dL
Hgb A1c MFr Bld: 5.3 % (ref 4.8–5.6)

## 2022-09-27 LAB — LIPID PANEL
Chol/HDL Ratio: 2.7 ratio (ref 0.0–5.0)
Cholesterol, Total: 122 mg/dL (ref 100–199)
HDL: 45 mg/dL (ref >39)
LDL Chol Calc (NIH): 65 mg/dL (ref 0–99)
Triglycerides: 55 mg/dL (ref 0–149)
VLDL Cholesterol Cal: 12 mg/dL (ref 5–40)

## 2022-09-27 LAB — VITAMIN D 25 HYDROXY (VIT D DEFICIENCY, FRACTURES): Vit D, 25-Hydroxy: 37.7 ng/mL (ref 30.0–100.0)

## 2022-09-27 LAB — TSH: TSH: 1.51 u[IU]/mL (ref 0.450–4.500)

## 2022-09-28 DIAGNOSIS — M1712 Unilateral primary osteoarthritis, left knee: Secondary | ICD-10-CM | POA: Diagnosis not present

## 2022-09-28 DIAGNOSIS — M25511 Pain in right shoulder: Secondary | ICD-10-CM | POA: Diagnosis not present

## 2022-10-07 ENCOUNTER — Encounter: Payer: Self-pay | Admitting: Internal Medicine

## 2022-10-07 ENCOUNTER — Ambulatory Visit (INDEPENDENT_AMBULATORY_CARE_PROVIDER_SITE_OTHER): Payer: Medicare HMO | Admitting: Internal Medicine

## 2022-10-07 VITALS — BP 138/70 | HR 70 | Ht 66.0 in | Wt 168.0 lb

## 2022-10-07 DIAGNOSIS — Z23 Encounter for immunization: Secondary | ICD-10-CM | POA: Diagnosis not present

## 2022-10-07 DIAGNOSIS — I10 Essential (primary) hypertension: Secondary | ICD-10-CM

## 2022-10-07 DIAGNOSIS — Z0001 Encounter for general adult medical examination with abnormal findings: Secondary | ICD-10-CM

## 2022-10-07 NOTE — Patient Instructions (Signed)
Please continue taking medications as prescribed.  Please continue to follow low salt diet and perform moderate exercise/walking at least 150 mins/week.  Please consider getting Shingrix and Tdap vaccine at local pharmacy.

## 2022-10-07 NOTE — Assessment & Plan Note (Signed)

## 2022-10-07 NOTE — Assessment & Plan Note (Addendum)
BP Readings from Last 1 Encounters:  10/07/22 138/70   Well-controlled with Irbesartan-HCTZ 150-12.5 mg 2 tablets QD Had allergic reaction to Amlodipine and other CCBs in the past DC Hydralazine Counseled for compliance with the medications Advised DASH diet and moderate exercise/walking, at least 150 mins/week

## 2022-10-07 NOTE — Progress Notes (Signed)
Established Patient Office Visit  Subjective:  Patient ID: Kenneth Booker, male    DOB: 1951/04/09  Age: 71 y.o. MRN: [id number]  CC:  Chief Complaint  Patient presents with   Annual Exam    HPI Kenneth Booker is a 71 y.o. male with past medical history of HTN, prostate cancer s/p total prostatectomy and OA of knee who presents for annual physical.  HTN: His BP is well-controlled with Avalide only.  He has resumed his exercise regimen.  He could not tolerate hydralazine and had to stop taking it.  He currently denies any headache, dizziness, chest pain, dyspnea or palpitations.  His BP readings are around 120s/70s.  He received PCV20 in the office today.  Past Medical History:  Diagnosis Date   Cancer [facility])    Cartilage tear 2009   right knee   PONV (postoperative nausea and vomiting)    Systemic hypertension     Past Surgical History:  Procedure Laterality Date   CARDIOVASCULAR STRESS TEST  02/20/2002   Negative adequate Bruce protocal exercise stress test, there is scintigraphic evidence of apical thinning with normal dynamic images EF 60% calculated by QGS   CATARACT EXTRACTION W/PHACO Right 05/16/2013   Procedure: CATARACT EXTRACTION PHACO AND INTRAOCULAR LENS PLACEMENT ([place]);  Surgeon: Tonny Branch, MD;  Location: [facility];  Service: Ophthalmology;  Laterality: Right;  CDE:5.25   CATARACT EXTRACTION W/PHACO Left 06/03/2013   Procedure: CATARACT EXTRACTION PHACO AND INTRAOCULAR LENS PLACEMENT ([facility]);  Surgeon: Tonny Branch, MD;  Location: [facility];  Service: Ophthalmology;  Laterality: Left;  CDE: 6.26   JOINT REPLACEMENT Left 09/23/2021   Done by Dr Percell Miller   KNEE CARTILAGE SURGERY Right 2007?   RENAL DUPLEX  02/20/2003   Bilateral renal arteries demonstrate normal valveswith no hemodynamically significance. Bilateral kidneysare symmetrical in shape with no obvious abnormality visualized.   ROBOT ASSISTED LAPAROSCOPIC RADICAL PROSTATECTOMY N/A 11/11/2019   Procedure: XI  ROBOTIC ASSISTED LAPAROSCOPIC RADICAL PROSTATECTOMY LEVEL 1;  Surgeon: Raynelle Bring, MD;  Location: [facility];  Service: Urology;  Laterality: N/A;   TRANSTHORACIC ECHOCARDIOGRAM  03/20/2012   EF >55%, suggestive of borderline impaired LV relaxation with normal tissue doppler, mild MR, mild TR.     Family History  Problem Relation Age of Onset   Heart attack Mother    Cancer Father        Lung cancer   Hypertension Sister    Cancer Sister 24       Breast cancer   Cancer Brother        Colon cancer   Diabetes Paternal Grandmother    Hypertension Sister    Cancer Brother        Stomach cancer    Social History   Socioeconomic History   Marital status: Married    Spouse name: Not on file   Number of children: Not on file   Years of education: Not on file   Highest education level: Not on file  Occupational History   Not on file  Tobacco Use   Smoking status: Never   Smokeless tobacco: Never  Vaping Use   Vaping Use: Never used  Substance and Sexual Activity   Alcohol use: No   Drug use: No   Sexual activity: Not Currently  Other Topics Concern   Not on file  Social History Narrative   Not on file   Social Determinants of Health   Financial Resource Strain: Low Risk  (10/19/2021)   Overall Emergency planning/management officer Strain (  CARDIA)    Difficulty of Paying Living Expenses: Not hard at all  Food Insecurity: No Food Insecurity (10/19/2021)   Hunger Vital Sign    Worried About Running Out of Food in the Last Year: Never true    Ran Out of Food in the Last Year: Never true  Transportation Needs: No Transportation Needs (10/19/2021)   PRAPARE - Hydrologist (Medical): No    Lack of Transportation (Non-Medical): No  Physical Activity: Inactive (10/19/2021)   Exercise Vital Sign    Days of Exercise per Week: 0 days    Minutes of Exercise per Session: 0 min  Stress: No Stress Concern Present (10/19/2021)   [place]    Feeling of Stress : Not at all  Social Connections: [place] (10/19/2021)   Social Connection and Isolation Panel [NHANES]    Frequency of Communication with Friends and Family: More than three times a week    Frequency of Social Gatherings with Friends and Family: Once a week    Attends Religious Services: More than 4 times per year    Active Member of [employer] or Organizations: Yes    Attends Archivist Meetings: Never    Marital Status: Married  Human resources officer Violence: Not At Risk (10/19/2021)   Humiliation, Afraid, Rape, and Kick questionnaire    Fear of Current or Ex-Partner: No    Emotionally Abused: No    Physically Abused: No    Sexually Abused: No    Outpatient Medications Prior to Visit  Medication Sig Dispense Refill   irbesartan-hydrochlorothiazide (AVALIDE) 150-12.5 MG tablet Take 2 tablets by mouth daily. 180 tablet 3   hydrALAZINE (APRESOLINE) 10 MG tablet Take 1 tablet (10 mg total) by mouth 2 (two) times daily. 60 tablet 2   No facility-administered medications prior to visit.    Allergies  Allergen Reactions   Norvasc [Amlodipine Besylate] Anxiety   Tarka [Trandolapril-Verapamil Hcl Er] Anxiety    ROS Review of Systems  Constitutional:  Negative for chills and fever.  HENT:  Negative for congestion and sore throat.   Eyes:  Negative for pain and discharge.  Respiratory:  Negative for cough and shortness of breath.   Cardiovascular:  Negative for chest pain and palpitations.  Gastrointestinal:  Negative for constipation, diarrhea, nausea and vomiting.  Endocrine: Negative for polydipsia and polyuria.  Genitourinary:  Negative for dysuria and hematuria.  Musculoskeletal:  Positive for arthralgias. Negative for neck pain and neck stiffness.  Skin:  Negative for rash.  Neurological:  Negative for dizziness, weakness, numbness and headaches.  Psychiatric/Behavioral:  Negative for agitation and behavioral  problems.       Objective:    Physical Exam Vitals reviewed.  Constitutional:      General: He is not in acute distress.    Appearance: He is not diaphoretic.  HENT:     Head: Normocephalic and atraumatic.     Mouth/Throat:     Mouth: Mucous membranes are moist.  Eyes:     General: No scleral icterus.    Extraocular Movements: Extraocular movements intact.  Cardiovascular:     Rate and Rhythm: Normal rate and regular rhythm.     Pulses: Normal pulses.     Heart sounds: Normal heart sounds. No murmur heard. Pulmonary:     Breath sounds: Normal breath sounds. No wheezing or rales.  Abdominal:     Palpations: Abdomen is soft.     Tenderness:  There is no abdominal tenderness.  Musculoskeletal:     Cervical back: Neck supple. No tenderness.     Right lower leg: No edema.     Left lower leg: No edema.     Comments: S/p left TKA, incision site C/D/I  Skin:    General: Skin is warm.     Findings: No rash.  Neurological:     General: No focal deficit present.     Mental Status: He is alert and oriented to person, place, and time.     Cranial Nerves: No cranial nerve deficit.     Sensory: No sensory deficit.     Motor: No weakness.  Psychiatric:        Mood and Affect: Mood normal.        Behavior: Behavior normal.     BP 138/70 (BP Location: Right Arm, Cuff Size: Normal)   Pulse 70   Ht _0  (1.676 m)   Wt 168 lb (76.2 kg)   SpO2 98%   BMI 27.12 kg/m  Wt Readings from Last 3 Encounters:  10/07/22 168 lb (76.2 kg)  08/08/22 174 lb (78.9 kg)  07/18/22 170 lb 6.4 oz (77.3 kg)    Lab Results  Component Value Date   TSH 1.510 09/26/2022   Lab Results  Component Value Date   WBC 2.8 (L) 09/26/2022   HGB 13.2 09/26/2022   HCT 37.6 09/26/2022   MCV 87 09/26/2022   PLT 273 09/26/2022   Lab Results  Component Value Date   NA 140 09/26/2022   K 4.9 09/26/2022   CO2 24 09/26/2022   GLUCOSE 96 09/26/2022   BUN 14 09/26/2022   CREATININE 1.00 09/26/2022    BILITOT 0.7 09/26/2022   ALKPHOS 64 09/26/2022   AST 15 09/26/2022   ALT 11 09/26/2022   PROT 6.1 09/26/2022   ALBUMIN 3.8 09/26/2022   CALCIUM 9.1 09/26/2022   ANIONGAP 7 11/12/2019   EGFR 80 09/26/2022   Lab Results  Component Value Date   CHOL 122 09/26/2022   Lab Results  Component Value Date   HDL 45 09/26/2022   Lab Results  Component Value Date   LDLCALC 65 09/26/2022   Lab Results  Component Value Date   TRIG 55 09/26/2022   Lab Results  Component Value Date   CHOLHDL 2.7 09/26/2022   Lab Results  Component Value Date   HGBA1C 5.3 09/26/2022      Assessment & Plan:   Problem List Items Addressed This Visit       Cardiovascular and Mediastinum   Essential hypertension    BP Readings from Last 1 Encounters:  10/07/22 138/70  Well-controlled with Irbesartan-HCTZ 150-12.5 mg 2 tablets QD Had allergic reaction to Amlodipine and other CCBs in the past DC Hydralazine Counseled for compliance with the medications Advised DASH diet and moderate exercise/walking, at least 150 mins/week        Other   Encounter for general adult medical examination with abnormal findings - Primary    Physical exam as documented. Counseling done  re healthy lifestyle involving commitment to 150 minutes exercise per week, heart healthy diet, and attaining healthy weight.The importance of adequate sleep also discussed. Changes in health habits are decided on by the patient with goals and time frames  set for achieving them. Immunization and cancer screening needs are specifically addressed at this visit.      Other Visit Diagnoses     Need for pneumococcal 20-valent conjugate vaccination  Relevant Orders   Pneumococcal conjugate vaccine 20-valent (Completed)       No orders of the defined types were placed in this encounter.   Follow-up: Return in about 6 months (around 04/08/2023) for HTN.    Lindell Spar, MD

## 2022-11-02 ENCOUNTER — Other Ambulatory Visit: Payer: Self-pay | Admitting: Internal Medicine

## 2022-11-02 DIAGNOSIS — I10 Essential (primary) hypertension: Secondary | ICD-10-CM

## 2022-11-14 ENCOUNTER — Telehealth: Payer: Self-pay | Admitting: Internal Medicine

## 2022-11-14 NOTE — Telephone Encounter (Signed)
Pt called stating that he only received 90day qty of irbesartan-hydrochlorothiazide (AVALIDE) 150-12.5 MG tablet. He wants to know why he did not get the 180qty?

## 2022-11-14 NOTE — Telephone Encounter (Signed)
Spoke to patient

## 2022-11-16 DIAGNOSIS — C61 Malignant neoplasm of prostate: Secondary | ICD-10-CM | POA: Diagnosis not present

## 2022-11-23 DIAGNOSIS — C61 Malignant neoplasm of prostate: Secondary | ICD-10-CM | POA: Diagnosis not present

## 2022-11-23 DIAGNOSIS — N5231 Erectile dysfunction following radical prostatectomy: Secondary | ICD-10-CM | POA: Diagnosis not present

## 2022-11-23 DIAGNOSIS — R311 Benign essential microscopic hematuria: Secondary | ICD-10-CM | POA: Diagnosis not present

## 2022-11-28 DIAGNOSIS — M1712 Unilateral primary osteoarthritis, left knee: Secondary | ICD-10-CM | POA: Diagnosis not present

## 2022-12-23 ENCOUNTER — Ambulatory Visit (INDEPENDENT_AMBULATORY_CARE_PROVIDER_SITE_OTHER): Payer: Medicare HMO | Admitting: Internal Medicine

## 2022-12-23 ENCOUNTER — Encounter: Payer: Self-pay | Admitting: Internal Medicine

## 2022-12-23 VITALS — BP 136/72 | HR 69 | Ht 66.0 in | Wt 169.4 lb

## 2022-12-23 DIAGNOSIS — Z8546 Personal history of malignant neoplasm of prostate: Secondary | ICD-10-CM

## 2022-12-23 DIAGNOSIS — R3129 Other microscopic hematuria: Secondary | ICD-10-CM

## 2022-12-23 DIAGNOSIS — I10 Essential (primary) hypertension: Secondary | ICD-10-CM

## 2022-12-23 NOTE — Assessment & Plan Note (Addendum)
S/p total prostatectomy Followed by Dr Alinda Money -PSA undetectable in 02/24

## 2022-12-23 NOTE — Progress Notes (Signed)
Acute Office Visit  Subjective:    Patient ID: Kenneth Booker, male    DOB: [birth year], 72 y.o.   MRN: [id number]  Chief Complaint  Patient presents with   Prostate Cancer    Patient has had prostate cancer and would like a second opinion on what he needs to do.    HPI Patient is in today for discussion of my microscopic hematuria.  He has had blood in UA for a long time.  He has history of prostate cancer, s/p total prostatectomy.  He sees Dr. Alinda Money in [place].  He has been told of blood in urine, which is chronic.  He has not noticed any gross hematuria.  He denies any dysuria, urinary hesitancy or resistance currently.  He asks for second opinion for microscopic hematuria.  Of note, he has not had any imaging to evaluate microscopic hematuria.  Past Medical History:  Diagnosis Date   Cancer [facility])    Cartilage tear 2009   right knee   PONV (postoperative nausea and vomiting)    Systemic hypertension     Past Surgical History:  Procedure Laterality Date   CARDIOVASCULAR STRESS TEST  02/20/2002   Negative adequate Bruce protocal exercise stress test, there is scintigraphic evidence of apical thinning with normal dynamic images EF 60% calculated by QGS   CATARACT EXTRACTION W/PHACO Right 05/16/2013   Procedure: CATARACT EXTRACTION PHACO AND INTRAOCULAR LENS PLACEMENT (Micro);  Surgeon: Tonny Branch, MD;  Location: [facility];  Service: Ophthalmology;  Laterality: Right;  CDE:5.25   CATARACT EXTRACTION W/PHACO Left 06/03/2013   Procedure: CATARACT EXTRACTION PHACO AND INTRAOCULAR LENS PLACEMENT (IOC);  Surgeon: Tonny Branch, MD;  Location: [facility];  Service: Ophthalmology;  Laterality: Left;  CDE: 6.26   JOINT REPLACEMENT Left 09/23/2021   Done by Dr Percell Miller   KNEE CARTILAGE SURGERY Right 2007?   RENAL DUPLEX  02/20/2003   Bilateral renal arteries demonstrate normal valveswith no hemodynamically significance. Bilateral kidneysare symmetrical in shape with no obvious abnormality  visualized.   ROBOT ASSISTED LAPAROSCOPIC RADICAL PROSTATECTOMY N/A 11/11/2019   Procedure: XI ROBOTIC ASSISTED LAPAROSCOPIC RADICAL PROSTATECTOMY LEVEL 1;  Surgeon: Raynelle Bring, MD;  Location: [facility];  Service: Urology;  Laterality: N/A;   TRANSTHORACIC ECHOCARDIOGRAM  03/20/2012   EF >55%, suggestive of borderline impaired LV relaxation with normal tissue doppler, mild MR, mild TR.     Family History  Problem Relation Age of Onset   Heart attack Mother    Cancer Father        Lung cancer   Hypertension Sister    Cancer Sister 16       Breast cancer   Cancer Brother        Colon cancer   Diabetes Paternal Grandmother    Hypertension Sister    Cancer Brother        Stomach cancer    Social History   Socioeconomic History   Marital status: Married    Spouse name: Not on file   Number of children: Not on file   Years of education: Not on file   Highest education level: Not on file  Occupational History   Not on file  Tobacco Use   Smoking status: Never   Smokeless tobacco: Never  Vaping Use   Vaping Use: Never used  Substance and Sexual Activity   Alcohol use: No   Drug use: No   Sexual activity: Not Currently  Other Topics Concern   Not on file  Social History Narrative  Not on file   Social Determinants of Health   Financial Resource Strain: Low Risk  (10/19/2021)   Overall Financial Resource Strain (CARDIA)    Difficulty of Paying Living Expenses: Not hard at all  Food Insecurity: No Food Insecurity (10/19/2021)   Hunger Vital Sign    Worried About Running Out of Food in the Last Year: Never true    Ran Out of Food in the Last Year: Never true  Transportation Needs: No Transportation Needs (10/19/2021)   PRAPARE - Hydrologist (Medical): No    Lack of Transportation (Non-Medical): No  Physical Activity: Inactive (10/19/2021)   Exercise Vital Sign    Days of Exercise per Week: 0 days    Minutes of Exercise per Session: 0 min   Stress: No Stress Concern Present (10/19/2021)   [place]    Feeling of Stress : Not at all  Social Connections: [place] (10/19/2021)   Social Connection and Isolation Panel [NHANES]    Frequency of Communication with Friends and Family: More than three times a week    Frequency of Social Gatherings with Friends and Family: Once a week    Attends Religious Services: More than 4 times per year    Active Member of [employer] or Organizations: Yes    Attends Archivist Meetings: Never    Marital Status: Married  Human resources officer Violence: Not At Risk (10/19/2021)   Humiliation, Afraid, Rape, and Kick questionnaire    Fear of Current or Ex-Partner: No    Emotionally Abused: No    Physically Abused: No    Sexually Abused: No    Outpatient Medications Prior to Visit  Medication Sig Dispense Refill   irbesartan-hydrochlorothiazide (AVALIDE) 150-12.5 MG tablet Take 2 tablets by mouth daily. 180 tablet 3   No facility-administered medications prior to visit.    Allergies  Allergen Reactions   Norvasc [Amlodipine Besylate] Anxiety   Tarka [Trandolapril-Verapamil Hcl Er] Anxiety    Review of Systems  Constitutional:  Negative for chills and fever.  HENT:  Negative for congestion and sore throat.   Eyes:  Negative for pain and discharge.  Respiratory:  Negative for cough and shortness of breath.   Cardiovascular:  Negative for chest pain and palpitations.  Gastrointestinal:  Negative for constipation, diarrhea, nausea and vomiting.  Endocrine: Negative for polydipsia and polyuria.  Genitourinary:  Negative for dysuria and hematuria.  Musculoskeletal:  Positive for arthralgias. Negative for neck pain and neck stiffness.  Skin:  Negative for rash.  Neurological:  Negative for dizziness, weakness, numbness and headaches.  Psychiatric/Behavioral:  Negative for agitation and behavioral problems.         Objective:    Physical Exam Vitals reviewed.  Constitutional:      General: He is not in acute distress.    Appearance: He is not diaphoretic.  HENT:     Head: Normocephalic and atraumatic.     Mouth/Throat:     Mouth: Mucous membranes are moist.  Eyes:     General: No scleral icterus.    Extraocular Movements: Extraocular movements intact.  Cardiovascular:     Rate and Rhythm: Normal rate and regular rhythm.     Pulses: Normal pulses.     Heart sounds: Normal heart sounds. No murmur heard. Pulmonary:     Breath sounds: Normal breath sounds. No wheezing or rales.  Abdominal:     Palpations: Abdomen is soft.  Tenderness: There is no abdominal tenderness.  Musculoskeletal:     Cervical back: Neck supple. No tenderness.     Right lower leg: No edema.     Left lower leg: No edema.     Comments: S/p left TKA, incision site C/D/I  Skin:    General: Skin is warm.     Findings: No rash.  Neurological:     General: No focal deficit present.     Mental Status: He is alert and oriented to person, place, and time.     Sensory: No sensory deficit.     Motor: No weakness.  Psychiatric:        Mood and Affect: Mood normal.        Behavior: Behavior normal.     BP 136/72 (BP Location: Left Arm, Cuff Size: Normal)   Pulse 69   Ht 5\' 6"  (1.676 m)   Wt 169 lb 6.4 oz (76.8 kg)   SpO2 91%   BMI 27.34 kg/m  Wt Readings from Last 3 Encounters:  12/23/22 169 lb 6.4 oz (76.8 kg)  10/07/22 168 lb (76.2 kg)  08/08/22 174 lb (78.9 kg)        Assessment & Plan:   Problem List Items Addressed This Visit       Cardiovascular and Mediastinum   Essential hypertension    BP Readings from Last 1 Encounters:  12/23/22 136/72  Well-controlled with Irbesartan-HCTZ 150-12.5 mg 2 tablets QD Had allergic reaction to Amlodipine and other CCBs in the past DC Hydralazine Counseled for compliance with the medications Advised DASH diet and moderate exercise/walking, at least 150  mins/week        Genitourinary   Persistent microscopic hematuria - Primary    Asymptomatic Has persistent microscopic hematuria Referred to urology in [place] for second opinion      Relevant Orders   Ambulatory referral to Urology     Other   History of prostate cancer    S/p total prostatectomy Followed by Dr Alinda Money -PSA undetectable in 02/24        No orders of the defined types were placed in this encounter.    Lindell Spar, MD

## 2022-12-23 NOTE — Assessment & Plan Note (Signed)
BP Readings from Last 1 Encounters:  12/23/22 136/72   Well-controlled with Irbesartan-HCTZ 150-12.5 mg 2 tablets QD Had allergic reaction to Amlodipine and other CCBs in the past DC Hydralazine Counseled for compliance with the medications Advised DASH diet and moderate exercise/walking, at least 150 mins/week

## 2022-12-23 NOTE — Assessment & Plan Note (Signed)
Asymptomatic Has persistent microscopic hematuria Referred to urology in [place] for second opinion

## 2022-12-23 NOTE — Patient Instructions (Addendum)
You are being referred to Urology.

## 2022-12-26 DIAGNOSIS — H524 Presbyopia: Secondary | ICD-10-CM | POA: Diagnosis not present

## 2023-01-02 IMAGING — US US ABDOMINAL AORTA SCREENING AAA
1 series · 7 of 7 positions shown · non-contrast
Comparison: Prior study on 11/18/2016

CLINICAL DATA: Male between 65-75 years of age with a smoking
history.

EXAM:
US ABDOMINAL AORTA MEDICARE SCREENING
TECHNIQUE: Ultrasound examination of the abdominal aorta was performed as a
screening evaluation for abdominal aortic aneurysm.

[Series 1: us abdominal aorta screening aaa · 0.30mm/px · 7 of 7 slices shown]
[im 1/7]
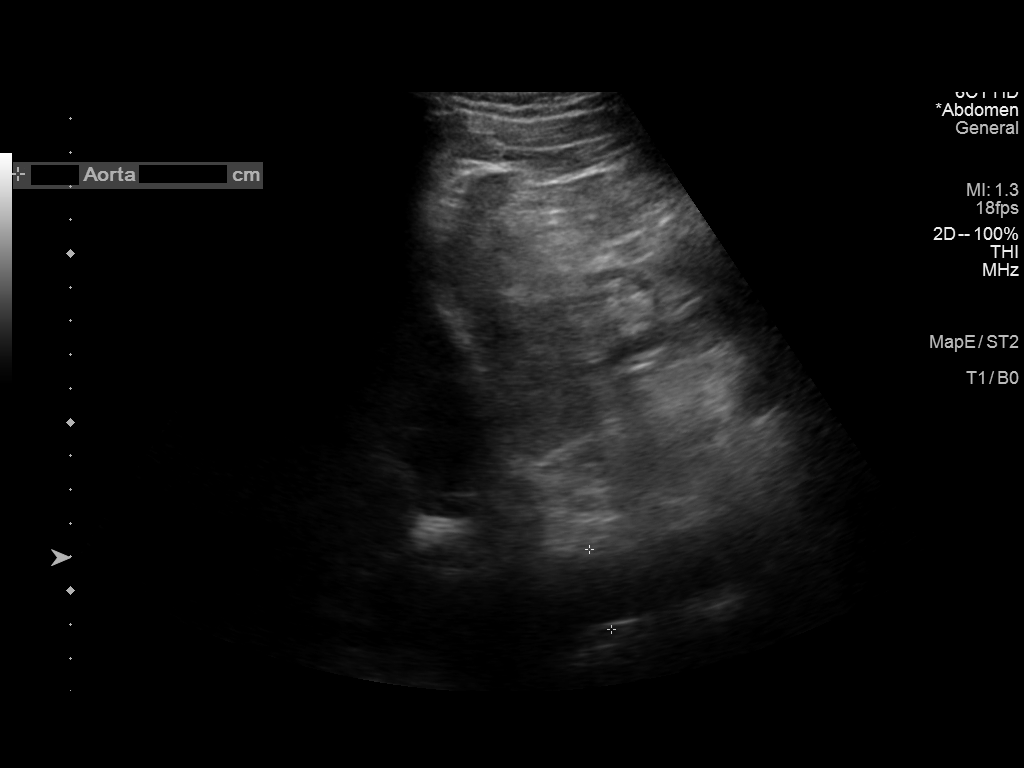
[im 2/7]
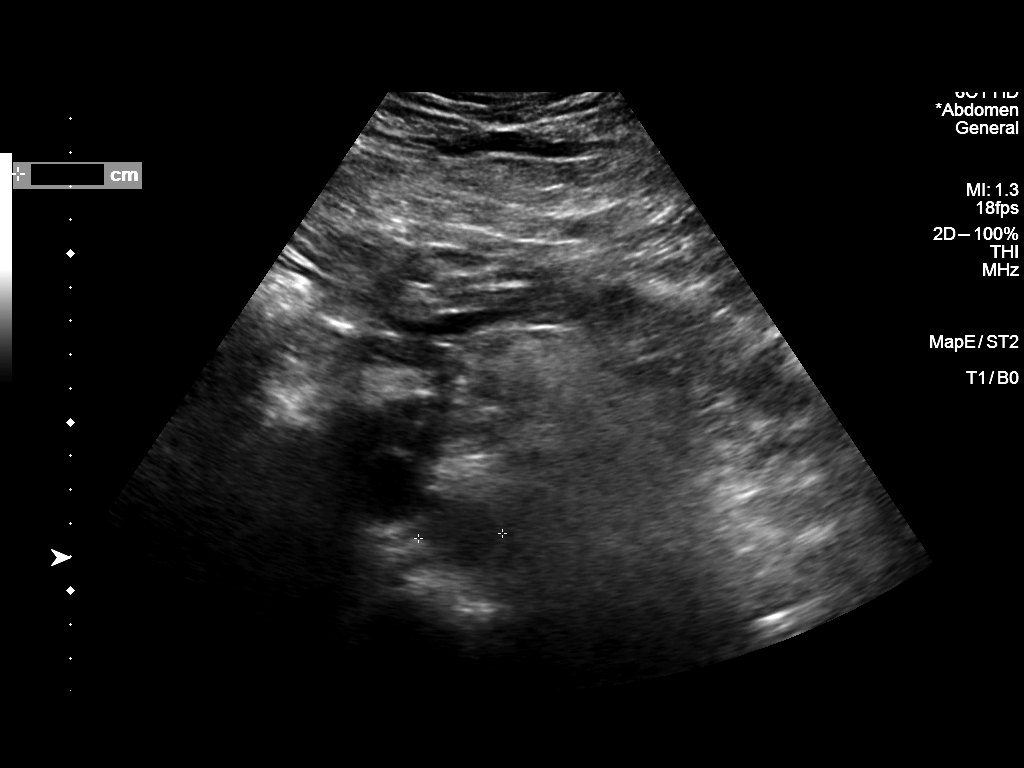
[im 3/7]
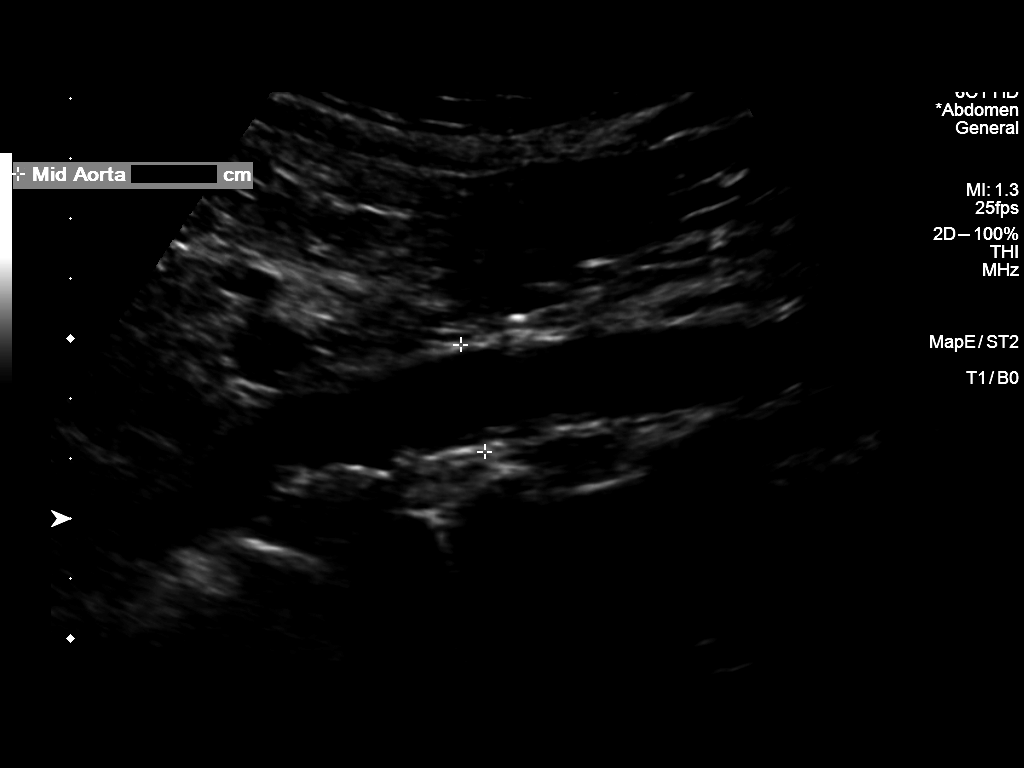
[im 4/7]
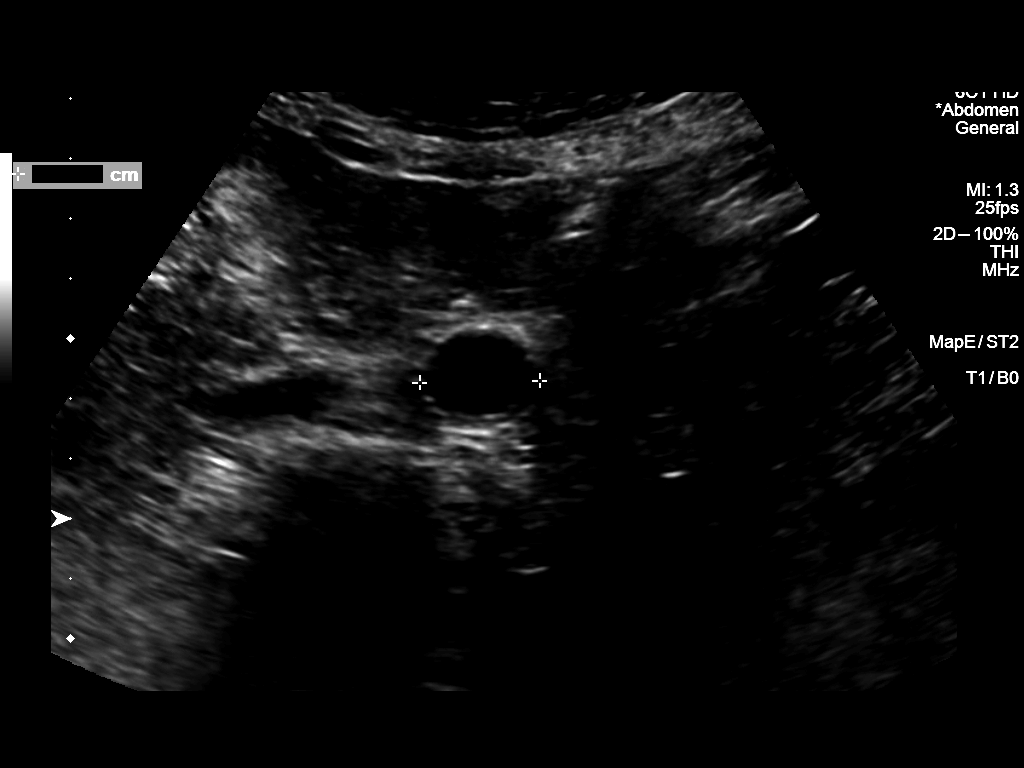
[im 5/7]
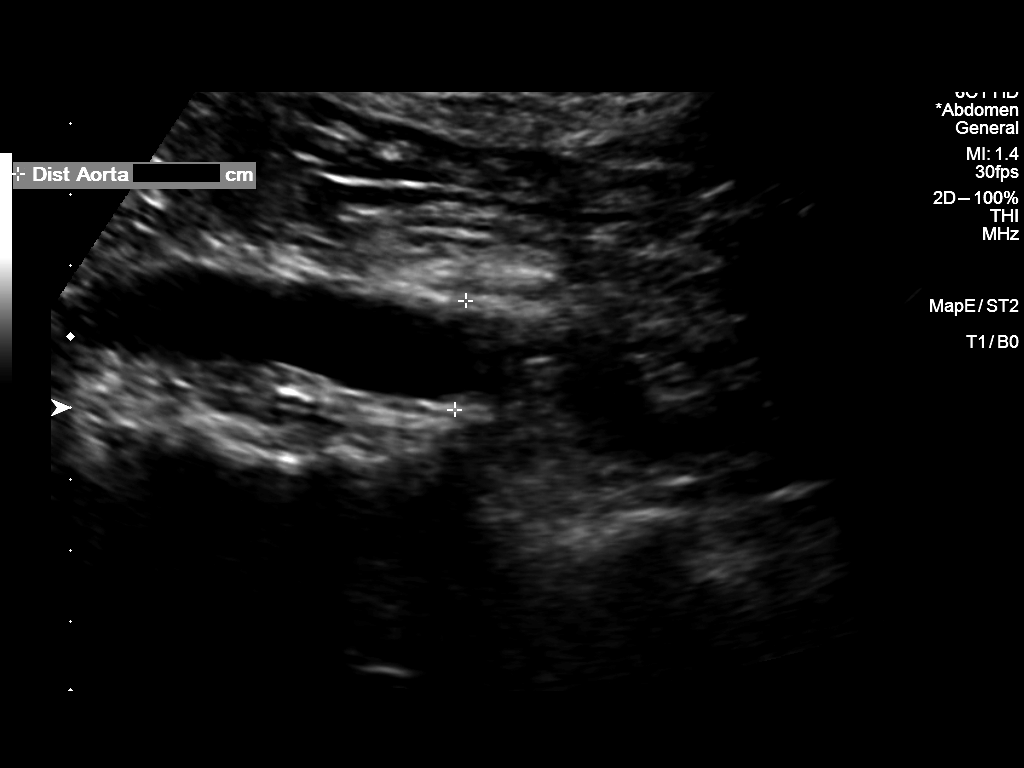
[im 6/7]
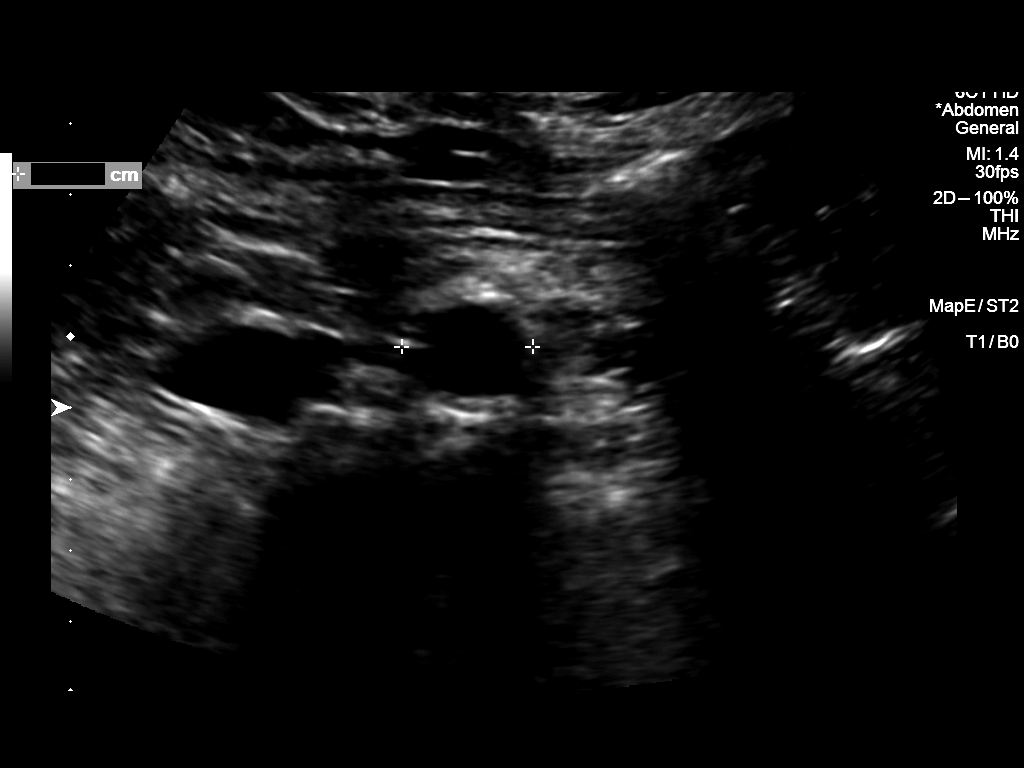
[im 7/7]
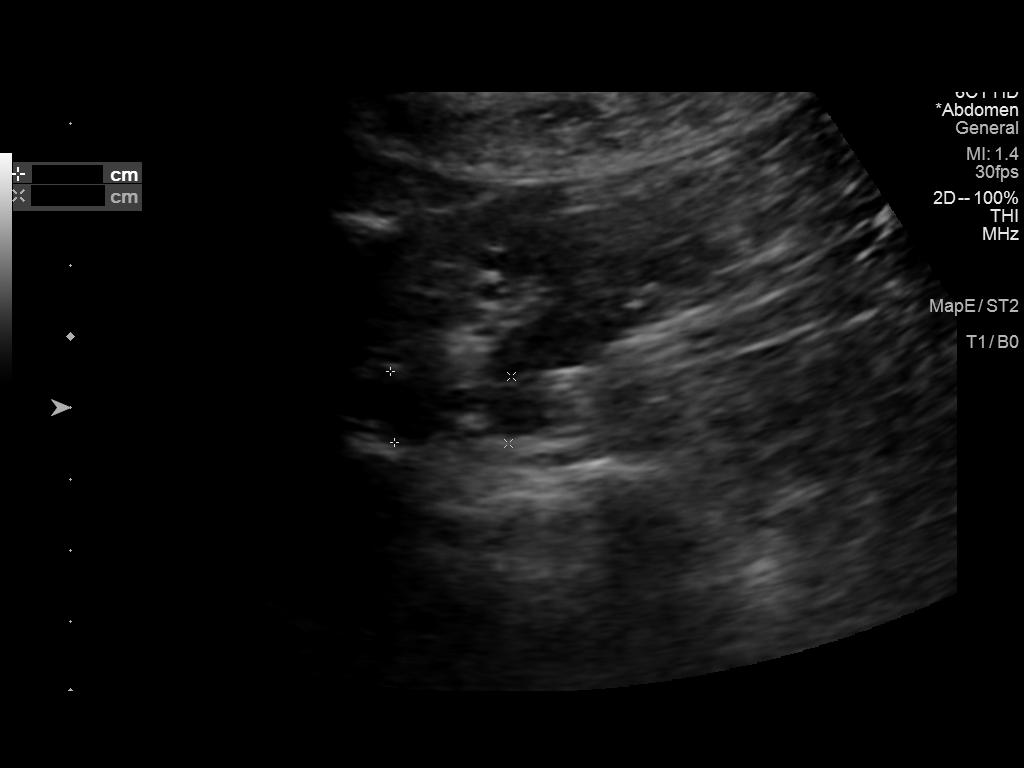

[7 of 7 positions shown; findings below may reference images not displayed]

FINDINGS: Abdominal aortic measurements as follows:

Proximal:  2.5 x 2.5 cm

Mid:  1.8 x 2.0 cm

Distal:  1.5 x 1.8 cm

Mild atherosclerosis present.
IMPRESSION: No evidence of abdominal aortic aneurysm. Mild atherosclerosis
present.

## 2023-02-06 ENCOUNTER — Telehealth: Payer: Self-pay | Admitting: Internal Medicine

## 2023-02-06 NOTE — Telephone Encounter (Signed)
Contacted [name] to schedule their annual wellness visit. Appointment made for 02/22/2023. Thank you,  Judeth Cornfield,  AMB Clinical Support [facility] AWV Program Direct Dial ??[phone]

## 2023-02-22 ENCOUNTER — Ambulatory Visit (INDEPENDENT_AMBULATORY_CARE_PROVIDER_SITE_OTHER): Payer: Medicare HMO

## 2023-02-22 VITALS — BP 130/77 | Ht 60.5 in | Wt 169.0 lb

## 2023-02-22 DIAGNOSIS — Z0000 Encounter for general adult medical examination without abnormal findings: Secondary | ICD-10-CM

## 2023-02-22 DIAGNOSIS — L578 Other skin changes due to chronic exposure to nonionizing radiation: Secondary | ICD-10-CM | POA: Diagnosis not present

## 2023-02-22 DIAGNOSIS — Z872 Personal history of diseases of the skin and subcutaneous tissue: Secondary | ICD-10-CM | POA: Diagnosis not present

## 2023-02-22 NOTE — Progress Notes (Signed)
 I connected with  Kenneth Booker on 02/22/23 by a audio enabled telemedicine application and verified that I am speaking with the correct person using two identifiers.  Patient Location: Home  Provider Location: Home Office  I discussed the limitations of evaluation and management by telemedicine. The patient expressed understanding and agreed to proceed.  Subjective:   Kenneth Booker is a 72 y.o. male who presents for Medicare Annual/Subsequent preventive examination.  Review of Systems     Cardiac Risk Factors include: advanced age (>36men, >88 women);hypertension;obesity (BMI >30kg/m2);male gender     Objective:    Today's Vitals   02/22/23 1458  BP: 130/77  Weight: 169 lb (76.7 kg)  Height: 5' 0.5" (1.537 m)  PainSc: 0-No pain   Body mass index is 32.46 kg/m.     10/19/2021   10:20 AM 11/11/2019    9:11 AM 11/07/2019    8:19 AM 06/03/2013   10:59 AM 05/07/2013    3:48 PM  Advanced Directives  Does Patient Have a Medical Advance Directive? No Yes Yes Patient does not have advance directive;Patient would not like information Patient does not have advance directive  Type of Advance Directive  Healthcare Power of [place];Living will Healthcare Power of [place];Living will    Does patient want to make changes to medical advance directive?  No - Patient declined     Copy of Healthcare Power of Attorney in Chart?  No - copy requested     Would patient like information on creating a medical advance directive? Yes (ED - Information included in AVS)        Current Medications (verified) Outpatient Encounter Medications as of 02/22/2023  Medication Sig   irbesartan-hydrochlorothiazide (AVALIDE) 150-12.5 MG tablet Take 2 tablets by mouth daily.   No facility-administered encounter medications on file as of 02/22/2023.    Allergies (verified) Amlodipine besylate and Tarka [trandolapril-verapamil hcl er]   History: Past Medical History:  Diagnosis Date   Cancer (HCC)     Cartilage tear 2009   right knee   PONV (postoperative nausea and vomiting)    Systemic hypertension    Past Surgical History:  Procedure Laterality Date   CARDIOVASCULAR STRESS TEST  02/20/2002   Negative adequate Bruce protocal exercise stress test, there is scintigraphic evidence of apical thinning with normal dynamic images EF 60% calculated by QGS   CATARACT EXTRACTION W/PHACO Right 05/16/2013   Procedure: CATARACT EXTRACTION PHACO AND INTRAOCULAR LENS PLACEMENT (IOC);  Surgeon: Gemma Payor, MD;  Location: [facility];  Service: Ophthalmology;  Laterality: Right;  CDE:5.25   CATARACT EXTRACTION W/PHACO Left 06/03/2013   Procedure: CATARACT EXTRACTION PHACO AND INTRAOCULAR LENS PLACEMENT (IOC);  Surgeon: Gemma Payor, MD;  Location: [facility];  Service: Ophthalmology;  Laterality: Left;  CDE: 6.26   JOINT REPLACEMENT Left 09/23/2021   Done by Dr Eulah Pont   KNEE CARTILAGE SURGERY Right 2007?   RENAL DUPLEX  02/20/2003   Bilateral renal arteries demonstrate normal valveswith no hemodynamically significance. Bilateral kidneysare symmetrical in shape with no obvious abnormality visualized.   ROBOT ASSISTED LAPAROSCOPIC RADICAL PROSTATECTOMY N/A 11/11/2019   Procedure: XI ROBOTIC ASSISTED LAPAROSCOPIC RADICAL PROSTATECTOMY LEVEL 1;  Surgeon: Heloise Purpura, MD;  Location: [facility];  Service: Urology;  Laterality: N/A;   TRANSTHORACIC ECHOCARDIOGRAM  03/20/2012   EF >55%, suggestive of borderline impaired LV relaxation with normal tissue doppler, mild MR, mild TR.    Family History  Problem Relation Age of Onset   Heart attack Mother    Cancer  Father        Lung cancer   Hypertension Sister    Cancer Sister 71       Breast cancer   Cancer Brother        Colon cancer   Diabetes Paternal Grandmother    Hypertension Sister    Cancer Brother        Stomach cancer   Social History   Socioeconomic History   Marital status: Married    Spouse name: Not on file   Number of children: Not on file    Years of education: Not on file   Highest education level: Not on file  Occupational History   Not on file  Tobacco Use   Smoking status: Never   Smokeless tobacco: Never  Vaping Use   Vaping Use: Never used  Substance and Sexual Activity   Alcohol use: No   Drug use: No   Sexual activity: Not Currently  Other Topics Concern   Not on file  Social History Narrative   Not on file   Social Determinants of Health   Financial Resource Strain: Low Risk  (02/22/2023)   Overall Financial Resource Strain (CARDIA)    Difficulty of Paying Living Expenses: Not hard at all  Food Insecurity: No Food Insecurity (02/22/2023)   Hunger Vital Sign    Worried About Running Out of Food in the Last Year: Never true    Ran Out of Food in the Last Year: Never true  Transportation Needs: No Transportation Needs (02/22/2023)   PRAPARE - Administrator, Civil Service (Medical): No    Lack of Transportation (Non-Medical): No  Physical Activity: Sufficiently Active (02/22/2023)   Exercise Vital Sign    Days of Exercise per Week: 7 days    Minutes of Exercise per Session: 30 min  Stress: No Stress Concern Present (02/22/2023)   [employer] of Occupational Health - Occupational Stress Questionnaire    Feeling of Stress : Not at all  Social Connections: Socially Integrated (02/22/2023)   Social Connection and Isolation Panel [NHANES]    Frequency of Communication with Friends and Family: More than three times a week    Frequency of Social Gatherings with Friends and Family: More than three times a week    Attends Religious Services: More than 4 times per year    Active Member of [employer] or Organizations: Yes    Attends Engineer, structural: More than 4 times per year    Marital Status: Married    Tobacco Counseling Counseling given: Yes   Clinical Intake:  Pre-visit preparation completed: Yes  Pain : No/denies pain Pain Score: 0-No pain     BMI - recorded:  32.46 Nutritional Status: BMI > 30  Obese Nutritional Risks: None Diabetes: No  How often do you need to have someone help you when you read instructions, pamphlets, or other written materials from your doctor or pharmacy?: 1 - Never  Diabetic?No  Interpreter Needed?: No  Information entered by ::  Shatina Streets, CMA   Activities of Daily Living    02/22/2023    3:07 PM  In your present state of health, do you have any difficulty performing the following activities:  Hearing? 0  Vision? 1  Comment wears eyeglasses  Difficulty concentrating or making decisions? 0  Walking or climbing stairs? 0  Dressing or bathing? 0  Doing errands, shopping? 0  Preparing Food and eating ? N  Using the Toilet? N  In the past six  months, have you accidently leaked urine? N  Do you have problems with loss of bowel control? N  Managing your Medications? N  Managing your Finances? N  Housekeeping or managing your Housekeeping? N    Patient Care Team: Anabel Halon, MD as PCP - General (Internal Medicine) Thurmon Fair, MD as PCP - Cardiology (Cardiology) Sharrell Ku, MD as Consulting Physician (Gastroenterology)  Indicate any recent Medical Services you may have received from other than [facility] providers in the past year (date may be approximate).     Assessment:   This is a routine wellness examination for Kenneth Booker.  Hearing/Vision screen Hearing Screening - Comments:: Patient denies any hearing difficulties.   Vision Screening - Comments:: Patient wears eyeglasses and is UTD with yearly eye exam.    Dietary issues and exercise activities discussed: Current Exercise Habits: Home exercise routine, Type of exercise: strength training/weights;yoga, Time (Minutes): 30, Frequency (Times/Week): 7, Weekly Exercise (Minutes/Week): 210, Intensity: Moderate, Exercise limited by: None identified   Goals Addressed             This Visit's Progress    Patient Stated       Patient stated  his goal is to remain as active as he is currently.        Depression Screen    02/22/2023    3:04 PM 12/23/2022   10:43 AM 10/07/2022    8:42 AM 08/08/2022    8:38 AM 07/18/2022    8:08 AM 04/01/2022    8:02 AM 10/19/2021   10:07 AM  PHQ 2/9 Scores  PHQ - 2 Score 0 0 0 0 0 0 1    Fall Risk    02/22/2023    3:01 PM 12/23/2022   10:43 AM 10/07/2022    8:42 AM 08/08/2022    8:38 AM 07/18/2022    8:08 AM  Fall Risk   Falls in the past year? 1 0 0 0 0  Number falls in past yr: 0 0 0 0 0  Injury with Fall? 0 0 0 0 0  Risk for fall due to : No Fall Risks   No Fall Risks No Fall Risks  Follow up Falls prevention discussed   Falls evaluation completed Falls evaluation completed  Comment patient tripped over a trailer tongue on 02/18/23 because he didn't pick his foot up far enough. No injuries        FALL RISK PREVENTION PERTAINING TO THE HOME:  Any stairs in or around the home? No  If so, are there any without handrails? No  Home free of loose throw rugs in walkways, pet beds, electrical cords, etc? Yes  Adequate lighting in your home to reduce risk of falls? Yes   ASSISTIVE DEVICES UTILIZED TO PREVENT FALLS:  Life alert? No  Use of a cane, walker or w/c? No  Grab bars in the bathroom? Yes Shower chair or bench in shower? Yes  Elevated toilet seat or a handicapped toilet? Yes   TIMED UP AND GO:  Was the test performed? No . Telephone visit  Cognitive Function:        02/22/2023    3:09 PM 10/19/2021   10:25 AM  6CIT Screen  What Year? 0 points 0 points  What month? 0 points 0 points  What time? 0 points 0 points  Count back from 20 0 points 0 points  Months in reverse 0 points 0 points  Repeat phrase 0 points 2 points  Total Score 0 points 2 points  Immunizations Immunization History  Administered Date(s) Administered   Fluad Quad(high Dose 65+) 07/07/2021, 07/18/2022   Influenza, High Dose Seasonal PF 07/16/2017, 06/12/2018   Moderna SARS-COV2 Booster  Vaccination 11/09/2020   Moderna Sars-Covid-2 Vaccination 12/12/2019, 01/17/2020   PNEUMOCOCCAL CONJUGATE-20 10/07/2022    TDAP status: Due, Education has been provided regarding the importance of this vaccine. Advised may receive this vaccine at local pharmacy or Health Dept. Aware to provide a copy of the vaccination record if obtained from local pharmacy or Health Dept. Verbalized acceptance and understanding.  Flu Vaccine status: Up to date  Pneumococcal vaccine status: Up to date  Covid-19 vaccine status: Completed vaccines  Qualifies for Shingles Vaccine? Yes   Zostavax completed No   Shingrix Completed?: No.    Education has been provided regarding the importance of this vaccine. Patient has been advised to call insurance company to determine out of pocket expense if they have not yet received this vaccine. Advised may also receive vaccine at local pharmacy or Health Dept. Verbalized acceptance and understanding.  Screening Tests Health Maintenance  Topic Date Due   DTaP/Tdap/Td (1 - Tdap) Never done   Zoster Vaccines- Shingrix (1 of 2) Never done   COVID-19 Vaccine (4 - 2023-24 season) 10/10/2023 (Originally 06/10/2022)   INFLUENZA VACCINE  05/11/2023   Medicare Annual Wellness (AWV)  02/22/2024   COLONOSCOPY (Pts 45-71yrs Insurance coverage will need to be confirmed)  03/26/2030   Pneumonia Vaccine 59+ Years old  Completed   Hepatitis C Screening  Completed   HPV VACCINES  Aged Out    Health Maintenance  Health Maintenance Due  Topic Date Due   DTaP/Tdap/Td (1 - Tdap) Never done   Zoster Vaccines- Shingrix (1 of 2) Never done    Colorectal cancer screening: Type of screening: Colonoscopy. Completed 03/26/2020. Repeat every 5 years  Lung Cancer Screening: (Low Dose CT Chest recommended if Age 22-80 years, 30 pack-year currently smoking OR have quit w/in 15years.) does not qualify.    Additional Screening:  Hepatitis C Screening: does qualify; Completed  12/28/2021  Vision Screening: Recommended annual ophthalmology exams for early detection of glaucoma and other disorders of the eye. Is the patient up to date with their annual eye exam?  Yes  Who is the provider or what is the name of the office in which the patient attends annual eye exams? [facility] If pt is not established with a provider, would they like to be referred to a provider to establish care?  Patient is already established .   Dental Screening: Recommended annual dental exams for proper oral hygiene  Community Resource Referral / Chronic Care Management: CRR required this visit?  No   CCM required this visit?  No      Plan:     I have personally reviewed and noted the following in the patient's chart:   Medical and social history Use of alcohol, tobacco or illicit drugs  Current medications and supplements including opioid prescriptions. Patient is not currently taking opioid prescriptions. Functional ability and status Nutritional status Physical activity Advanced directives List of other physicians Hospitalizations, surgeries, and ER visits in previous 12 months Vitals Screenings to include cognitive, depression, and falls Referrals and appointments  In addition, I have reviewed and discussed with patient certain preventive protocols, quality metrics, and best practice recommendations. A written personalized care plan for preventive services as well as general preventive health recommendations were provided to patient.   per request, patient was mailed a copy  of AVS.   Jordan Hawks Arya Boxley, CMA   02/22/2023   Nurse Notes: Patient states he has had the first of the Shingrix vaccine series. This was done at [facility]. Please request copy of vaccine information

## 2023-02-22 NOTE — Patient Instructions (Signed)
Kenneth Booker , Thank you for taking time to come for your Medicare Wellness Visit. I appreciate your ongoing commitment to your health goals. Please review the following plan we discussed and let me know if I can assist you in the future.   These are the goals we discussed:  Goals      Patient Stated     Patient stated his goal is to remain as active as he is currently.         This is a list of the screening recommended for you and due dates:  Health Maintenance  Topic Date Due   DTaP/Tdap/Td vaccine (1 - Tdap) Never done   Zoster (Shingles) Vaccine (1 of 2) Never done   COVID-19 Vaccine (4 - 2023-24 season) 10/10/2023*   Flu Shot  05/11/2023   Medicare Annual Wellness Visit  02/22/2024   Colon Cancer Screening  03/26/2030   Pneumonia Vaccine  Completed   Hepatitis C Screening: USPSTF Recommendation to screen - Ages 18-79 yo.  Completed   HPV Vaccine  Aged Out  *Topic was postponed. The date shown is not the original due date.    Advanced directives: Advance directive discussed with you today. Even though you declined this today, please call our office should you change your mind, and we can give you the proper paperwork for you to fill out.   Conditions/risks identified: Continue to stay active. Aim for 30 minutes of exercise or brisk walking, 6-8 glasses of water, and 5 servings of fruits and vegetables each day.   Next appointment: Follow up in one year for your annual wellness visit. Feb 28, 2024 at 3:00pm via telephone  Preventive Care 72 Years and Older, Male  Preventive care refers to lifestyle choices and visits with your health care provider that can promote health and wellness. What does preventive care include? A yearly physical exam. This is also called an annual well check. Dental exams once or twice a year. Routine eye exams. Ask your health care provider how often you should have your eyes checked. Personal lifestyle choices, including: Daily care of your  teeth and gums. Regular physical activity. Eating a healthy diet. Avoiding tobacco and drug use. Limiting alcohol use. Practicing safe sex. Taking low doses of aspirin every day. Taking vitamin and mineral supplements as recommended by your health care provider. What happens during an annual well check? The services and screenings done by your health care provider during your annual well check will depend on your age, overall health, lifestyle risk factors, and family history of disease. Counseling  Your health care provider may ask you questions about your: Alcohol use. Tobacco use. Drug use. Emotional well-being. Home and relationship well-being. Sexual activity. Eating habits. History of falls. Memory and ability to understand (cognition). Work and work Astronomer. Screening  You may have the following tests or measurements: Height, weight, and BMI. Blood pressure. Lipid and cholesterol levels. These may be checked every 5 years, or more frequently if you are over 69 years old. Skin check. Lung cancer screening. You may have this screening every year starting at age 66 if you have a 30-pack-year history of smoking and currently smoke or have quit within the past 15 years. Fecal occult blood test (FOBT) of the stool. You may have this test every year starting at age 68. Flexible sigmoidoscopy or colonoscopy. You may have a sigmoidoscopy every 5 years or a colonoscopy every 10 years starting at age 53. Prostate cancer screening. Recommendations will vary depending  on your family history and other risks. Hepatitis C blood test. Hepatitis B blood test. Sexually transmitted disease (STD) testing. Diabetes screening. This is done by checking your blood sugar (glucose) after you have not eaten for a while (fasting). You may have this done every 1-3 years. Abdominal aortic aneurysm (AAA) screening. You may need this if you are a current or former smoker. Osteoporosis. You may be  screened starting at age 32 if you are at high risk. Talk with your health care provider about your test results, treatment options, and if necessary, the need for more tests. Vaccines  Your health care provider may recommend certain vaccines, such as: Influenza vaccine. This is recommended every year. Tetanus, diphtheria, and acellular pertussis (Tdap, Td) vaccine. You may need a Td booster every 10 years. Zoster vaccine. You may need this after age 60. Pneumococcal 13-valent conjugate (PCV13) vaccine. One dose is recommended after age 72. Pneumococcal polysaccharide (PPSV23) vaccine. One dose is recommended after age 72. Talk to your health care provider about which screenings and vaccines you need and how often you need them. This information is not intended to replace advice given to you by your health care provider. Make sure you discuss any questions you have with your health care provider. Document Released: 10/23/2015 Document Revised: 06/15/2016 Document Reviewed: 07/28/2015 Elsevier Interactive Patient Education  2017 [employer] Prevention in the Home Falls can cause injuries. They can happen to people of all ages. There are many things you can do to make your home safe and to help prevent falls. What can I do on the outside of my home? Regularly fix the edges of walkways and driveways and fix any cracks. Remove anything that might make you trip as you walk through a door, such as a raised step or threshold. Trim any bushes or trees on the path to your home. Use bright outdoor lighting. Clear any walking paths of anything that might make someone trip, such as rocks or tools. Regularly check to see if handrails are loose or broken. Make sure that both sides of any steps have handrails. Any raised decks and porches should have guardrails on the edges. Have any leaves, snow, or ice cleared regularly. Use sand or salt on walking paths during winter. Clean up any spills in  your garage right away. This includes oil or grease spills. What can I do in the bathroom? Use night lights. Install grab bars by the toilet and in the tub and shower. Do not use towel bars as grab bars. Use non-skid mats or decals in the tub or shower. If you need to sit down in the shower, use a plastic, non-slip stool. Keep the floor dry. Clean up any water that spills on the floor as soon as it happens. Remove soap buildup in the tub or shower regularly. Attach bath mats securely with double-sided non-slip rug tape. Do not have throw rugs and other things on the floor that can make you trip. What can I do in the bedroom? Use night lights. Make sure that you have a light by your bed that is easy to reach. Do not use any sheets or blankets that are too big for your bed. They should not hang down onto the floor. Have a firm chair that has side arms. You can use this for support while you get dressed. Do not have throw rugs and other things on the floor that can make you trip. What can I do in the kitchen?  Clean up any spills right away. Avoid walking on wet floors. Keep items that you use a lot in easy-to-reach places. If you need to reach something above you, use a strong step stool that has a grab bar. Keep electrical cords out of the way. Do not use floor polish or wax that makes floors slippery. If you must use wax, use non-skid floor wax. Do not have throw rugs and other things on the floor that can make you trip. What can I do with my stairs? Do not leave any items on the stairs. Make sure that there are handrails on both sides of the stairs and use them. Fix handrails that are broken or loose. Make sure that handrails are as long as the stairways. Check any carpeting to make sure that it is firmly attached to the stairs. Fix any carpet that is loose or worn. Avoid having throw rugs at the top or bottom of the stairs. If you do have throw rugs, attach them to the floor with carpet  tape. Make sure that you have a Lesch switch at the top of the stairs and the bottom of the stairs. If you do not have them, ask someone to add them for you. What else can I do to help prevent falls? Wear shoes that: Do not have high heels. Have rubber bottoms. Are comfortable and fit you well. Are closed at the toe. Do not wear sandals. If you use a stepladder: Make sure that it is fully opened. Do not climb a closed stepladder. Make sure that both sides of the stepladder are locked into place. Ask someone to hold it for you, if possible. Clearly mark and make sure that you can see: Any grab bars or handrails. First and last steps. Where the edge of each step is. Use tools that help you move around (mobility aids) if they are needed. These include: Canes. Walkers. Scooters. Crutches. Turn on the lights when you go into a dark area. Replace any Dennen bulbs as soon as they burn out. Set up your furniture so you have a clear path. Avoid moving your furniture around. If any of your floors are uneven, fix them. If there are any pets around you, be aware of where they are. Review your medicines with your doctor. Some medicines can make you feel dizzy. This can increase your chance of falling. Ask your doctor what other things that you can do to help prevent falls. This information is not intended to replace advice given to you by your health care provider. Make sure you discuss any questions you have with your health care provider. Document Released: 07/23/2009 Document Revised: 03/03/2016 Document Reviewed: 10/31/2014 Elsevier Interactive Patient Education  2017 [employer].

## 2023-03-09 ENCOUNTER — Ambulatory Visit: Payer: Medicare HMO | Admitting: Urology

## 2023-03-09 VITALS — BP 130/76 | HR 65 | Ht 60.5 in | Wt 169.0 lb

## 2023-03-09 DIAGNOSIS — R3129 Other microscopic hematuria: Secondary | ICD-10-CM

## 2023-03-09 DIAGNOSIS — Z8546 Personal history of malignant neoplasm of prostate: Secondary | ICD-10-CM | POA: Diagnosis not present

## 2023-03-09 LAB — URINALYSIS, ROUTINE W REFLEX MICROSCOPIC
Bilirubin, UA: NEGATIVE
Glucose, UA: NEGATIVE
Ketones, UA: NEGATIVE
Leukocytes,UA: NEGATIVE
Nitrite, UA: NEGATIVE
Protein,UA: NEGATIVE
Specific Gravity, UA: <1.005 — ABNORMAL LOW (ref 1.005–1.030)
Urobilinogen, Ur: 0.2 mg/dL (ref 0.2–1.0)
pH, UA: 7 (ref 5.0–7.5)

## 2023-03-09 LAB — MICROSCOPIC EXAMINATION
Bacteria, UA: NONE SEEN
WBC, UA: NONE SEEN /hpf (ref 0–5)

## 2023-03-09 NOTE — Progress Notes (Signed)
Subjective: 1. Microscopic hematuria   2. History of prostate cancer      Consult requested by Kenneth Platt MD  [name] is a 72 yo male who had a history of  T2 GG2 prostate cancer with an RALP by Dr. Laverle Booker 3 years ago.  He has had microhematuria that was noted in February at his last visit.  His UA today has 3-10 RBC's.  He has minimal SUI without the need for a pad and he has recovered erectile function.  I don't see prior upper tract imaging or cystoscopy and Dr. Laverle Booker had suggested evaluation if the finding persisted.   ROS:  Review of Systems  All other systems reviewed and are negative.   Allergies  Allergen Reactions   Amlodipine Besylate Anxiety    Other Reaction(s): Angioedema   Tarka [Trandolapril-Verapamil Hcl Er] Anxiety    Past Medical History:  Diagnosis Date   Cancer (HCC)    Cartilage tear 2009   right knee   PONV (postoperative nausea and vomiting)    Systemic hypertension     Past Surgical History:  Procedure Laterality Date   CARDIOVASCULAR STRESS TEST  02/20/2002   Negative adequate Bruce protocal exercise stress test, there is scintigraphic evidence of apical thinning with normal dynamic images EF 60% calculated by QGS   CATARACT EXTRACTION W/PHACO Right 05/16/2013   Procedure: CATARACT EXTRACTION PHACO AND INTRAOCULAR LENS PLACEMENT (IOC);  Surgeon: Kenneth Payor, MD;  Location: [facility];  Service: Ophthalmology;  Laterality: Right;  CDE:5.25   CATARACT EXTRACTION W/PHACO Left 06/03/2013   Procedure: CATARACT EXTRACTION PHACO AND INTRAOCULAR LENS PLACEMENT (IOC);  Surgeon: Kenneth Payor, MD;  Location: [facility];  Service: Ophthalmology;  Laterality: Left;  CDE: 6.26   JOINT REPLACEMENT Left 09/23/2021   Done by Dr Kenneth Booker   KNEE CARTILAGE SURGERY Right 2007?   RENAL DUPLEX  02/20/2003   Bilateral renal arteries demonstrate normal valveswith no hemodynamically significance. Bilateral kidneysare symmetrical in shape with no obvious abnormality visualized.   ROBOT  ASSISTED LAPAROSCOPIC RADICAL PROSTATECTOMY N/A 11/11/2019   Procedure: XI ROBOTIC ASSISTED LAPAROSCOPIC RADICAL PROSTATECTOMY LEVEL 1;  Surgeon: Kenneth Purpura, MD;  Location: [facility];  Service: Urology;  Laterality: N/A;   TRANSTHORACIC ECHOCARDIOGRAM  03/20/2012   EF >55%, suggestive of borderline impaired LV relaxation with normal tissue doppler, mild MR, mild TR.     Social History   Socioeconomic History   Marital status: Married    Spouse name: Not on file   Number of children: Not on file   Years of education: Not on file   Highest education level: Not on file  Occupational History   Not on file  Tobacco Use   Smoking status: Never   Smokeless tobacco: Never  Vaping Use   Vaping Use: Never used  Substance and Sexual Activity   Alcohol use: No   Drug use: No   Sexual activity: Not Currently  Other Topics Concern   Not on file  Social History Narrative   Not on file   Social Determinants of Health   Financial Resource Strain: Low Risk  (02/22/2023)   Overall Financial Resource Strain (CARDIA)    Difficulty of Paying Living Expenses: Not hard at all  Food Insecurity: No Food Insecurity (02/22/2023)   Hunger Vital Sign    Worried About Running Out of Food in the Last Year: Never true    Ran Out of Food in the Last Year: Never true  Transportation Needs: No Transportation Needs (02/22/2023)   PRAPARE -  Administrator, Civil Service (Medical): No    Lack of Transportation (Non-Medical): No  Physical Activity: Sufficiently Active (02/22/2023)   Exercise Vital Sign    Days of Exercise per Week: 7 days    Minutes of Exercise per Session: 30 min  Stress: No Stress Concern Present (02/22/2023)   Kenneth Booker of Occupational Health - Occupational Stress Questionnaire    Feeling of Stress : Not at all  Social Connections: Socially Integrated (02/22/2023)   Social Connection and Isolation Panel [NHANES]    Frequency of Communication with Friends and Family: More  than three times a week    Frequency of Social Gatherings with Friends and Family: More than three times a week    Attends Religious Services: More than 4 times per year    Active Member of [employer] or Organizations: Yes    Attends Engineer, structural: More than 4 times per year    Marital Status: Married  Catering manager Violence: Not At Risk (02/22/2023)   Humiliation, Afraid, Rape, and Kick questionnaire    Fear of Current or Ex-Partner: No    Emotionally Abused: No    Physically Abused: No    Sexually Abused: No    Family History  Problem Relation Age of Onset   Heart attack Mother    Cancer Father        Lung cancer   Hypertension Sister    Cancer Sister 68       Breast cancer   Cancer Brother        Colon cancer   Diabetes Paternal Grandmother    Hypertension Sister    Cancer Brother        Stomach cancer    Anti-infectives: Anti-infectives (From admission, onward)    None       Current Outpatient Medications  Medication Sig Dispense Refill   irbesartan-hydrochlorothiazide (AVALIDE) 150-12.5 MG tablet Take 2 tablets by mouth daily. 180 tablet 3   No current facility-administered medications for this visit.     Objective: Vital signs in last 24 hours: BP 130/76   Pulse 65   Ht 5' 0.5" (1.537 m)   Wt 169 lb (76.7 kg)   BMI 32.46 kg/m   Intake/Output from previous day: No intake/output data recorded. Intake/Output this shift: @IOTHISSHIFT @   Physical Exam Vitals reviewed.  Constitutional:      Appearance: Normal appearance.  Neurological:     Mental Status: He is alert.     Lab Results:  Results for orders placed or performed in visit on 03/09/23 (from the past 24 hour(s))  Urinalysis, Routine w reflex microscopic     Status: Abnormal   Collection Time: 03/09/23  1:57 PM  Result Value Ref Range   Specific Gravity, UA <1.005 (L) 1.005 - 1.030   pH, UA 7.0 5.0 - 7.5   Color, UA Yellow Yellow   Appearance Ur Clear Clear    Leukocytes,UA Negative Negative   Protein,UA Negative Negative/Trace   Glucose, UA Negative Negative   Ketones, UA Negative Negative   RBC, UA 1+ (A) Negative   Bilirubin, UA Negative Negative   Urobilinogen, Ur 0.2 0.2 - 1.0 mg/dL   Nitrite, UA Negative Negative   Microscopic Examination See below:    Narrative   Performed at:  [address] - [facility] [address] [place], [place]  [id number] Lab Director: Chinita Pester MT, Phone:  [phone]  Microscopic Examination     Status: Abnormal   Collection Time: 03/09/23  1:57 PM   Urine  Result Value Ref Range   WBC, UA None seen 0 - 5 /hpf   RBC, Urine 3-10 (A) 0 - 2 /hpf   Epithelial Cells (non renal) 0-10 0 - 10 /hpf   Bacteria, UA None seen None seen/Few   Narrative   Performed at:  [address] - [facility] [address] [place], [place]  [id number] Lab Director: Chinita Pester MT, Phone:  [phone]    BMET No results for input(s): "NA", "K", "CL", "CO2", "GLUCOSE", "BUN", "CREATININE", "CALCIUM" in the last 72 hours. PT/INR No results for input(s): "LABPROT", "INR" in the last 72 hours. ABG No results for input(s): "PHART", "HCO3" in the last 72 hours.  Invalid input(s): "PCO2", "PO2"  Studies/Results: No results found.   Assessment/Plan: Microhematuria.  He has 3-10 RBC's today again.   I have recommended that her return to Dr. Laverle Booker for upper tract imaging and consideration of cystoscopy.   No orders of the defined types were placed in this encounter.    Orders Placed This Encounter  Procedures   Microscopic Examination   Urinalysis, Routine w reflex microscopic     Return for He just need to return to see Dr. Laverle Booker.  I will let him know. .    CC: Dr. Trena Booker and Dr. Crecencio Mc.      Kenneth Booker 03/10/2023

## 2023-03-22 DIAGNOSIS — R311 Benign essential microscopic hematuria: Secondary | ICD-10-CM | POA: Diagnosis not present

## 2023-04-10 ENCOUNTER — Encounter: Payer: Self-pay | Admitting: Internal Medicine

## 2023-04-10 ENCOUNTER — Ambulatory Visit (INDEPENDENT_AMBULATORY_CARE_PROVIDER_SITE_OTHER): Payer: Medicare HMO | Admitting: Internal Medicine

## 2023-04-10 VITALS — BP 128/75 | HR 68 | Ht 66.0 in | Wt 175.4 lb

## 2023-04-10 DIAGNOSIS — E782 Mixed hyperlipidemia: Secondary | ICD-10-CM | POA: Diagnosis not present

## 2023-04-10 DIAGNOSIS — R3129 Other microscopic hematuria: Secondary | ICD-10-CM | POA: Diagnosis not present

## 2023-04-10 DIAGNOSIS — Z8546 Personal history of malignant neoplasm of prostate: Secondary | ICD-10-CM

## 2023-04-10 DIAGNOSIS — I10 Essential (primary) hypertension: Secondary | ICD-10-CM

## 2023-04-10 DIAGNOSIS — I341 Nonrheumatic mitral (valve) prolapse: Secondary | ICD-10-CM

## 2023-04-10 NOTE — Assessment & Plan Note (Signed)
Asymptomatic Has had Cardiology evaluation 

## 2023-04-10 NOTE — Assessment & Plan Note (Signed)
S/p total prostatectomy Followed by Dr Borden -PSA undetectable in 02/24 

## 2023-04-10 NOTE — Assessment & Plan Note (Signed)
Reports h/o HLD Checked lipid profile Advised to follow DASH diet for now 

## 2023-04-10 NOTE — Assessment & Plan Note (Signed)
BP Readings from Last 1 Encounters:  04/10/23 128/75   Well-controlled with Irbesartan-HCTZ 150-12.5 mg 2 tablets QD Had allergic reaction to Amlodipine and other CCBs in the past DC Hydralazine Counseled for compliance with the medications Advised DASH diet and moderate exercise/walking, at least 150 mins/week

## 2023-04-10 NOTE — Progress Notes (Signed)
Established Patient Office Visit  Subjective:  Patient ID: Kenneth Booker, male    DOB: 1951/05/06  Age: 72 y.o. MRN: [id number]  CC:  Chief Complaint  Patient presents with   Hypertension    HPI Kenneth Booker is a 72 y.o. male with past medical history of HTN, prostate cancer s/p total prostatectomy and OA of knee who presents for f/u of his chronic medical conditions.  HTN: BP is well-controlled. His home BP readings are also better lately. Takes medications regularly. Patient denies headache, dizziness, chest pain, dyspnea or palpitations.  He saw Dr. Retta Booker for microscopic hematuria, and was referred back to Dr. Laverle Booker for upper urinary tract imaging and consideration of cystoscopy.  He had repeat UA at Dr. Vevelyn Booker office, which was negative for RBC.  He denies any dysuria, hematuria or urinary hesitancy or resistance.  Past Medical History:  Diagnosis Date   Cancer [facility])    Cartilage tear 2009   right knee   PONV (postoperative nausea and vomiting)    Systemic hypertension     Past Surgical History:  Procedure Laterality Date   CARDIOVASCULAR STRESS TEST  02/20/2002   Negative adequate Bruce protocal exercise stress test, there is scintigraphic evidence of apical thinning with normal dynamic images EF 60% calculated by QGS   CATARACT EXTRACTION W/PHACO Right 05/16/2013   Procedure: CATARACT EXTRACTION PHACO AND INTRAOCULAR LENS PLACEMENT (IOC);  Surgeon: Kenneth Payor, MD;  Location: [facility];  Service: Ophthalmology;  Laterality: Right;  CDE:5.25   CATARACT EXTRACTION W/PHACO Left 06/03/2013   Procedure: CATARACT EXTRACTION PHACO AND INTRAOCULAR LENS PLACEMENT (IOC);  Surgeon: Kenneth Payor, MD;  Location: [facility];  Service: Ophthalmology;  Laterality: Left;  CDE: 6.26   JOINT REPLACEMENT Left 09/23/2021   Done by Dr Kenneth Booker   KNEE CARTILAGE SURGERY Right 2007?   RENAL DUPLEX  02/20/2003   Bilateral renal arteries demonstrate normal valveswith no hemodynamically  significance. Bilateral kidneysare symmetrical in shape with no obvious abnormality visualized.   ROBOT ASSISTED LAPAROSCOPIC RADICAL PROSTATECTOMY N/A 11/11/2019   Procedure: XI ROBOTIC ASSISTED LAPAROSCOPIC RADICAL PROSTATECTOMY LEVEL 1;  Surgeon: Kenneth Purpura, MD;  Location: [facility];  Service: Urology;  Laterality: N/A;   TRANSTHORACIC ECHOCARDIOGRAM  03/20/2012   EF >55%, suggestive of borderline impaired LV relaxation with normal tissue doppler, mild MR, mild TR.     Family History  Problem Relation Age of Onset   Heart attack Mother    Cancer Father        Lung cancer   Hypertension Sister    Cancer Sister 46       Breast cancer   Cancer Brother        Colon cancer   Diabetes Paternal Grandmother    Hypertension Sister    Cancer Brother        Stomach cancer    Social History   Socioeconomic History   Marital status: Married    Spouse name: Not on file   Number of children: Not on file   Years of education: Not on file   Highest education level: Not on file  Occupational History   Not on file  Tobacco Use   Smoking status: Never   Smokeless tobacco: Never  Vaping Use   Vaping Use: Never used  Substance and Sexual Activity   Alcohol use: No   Drug use: No   Sexual activity: Not Currently  Other Topics Concern   Not on file  Social History Narrative   Not on  file   Social Determinants of Health   Financial Resource Strain: Low Risk  (02/22/2023)   Overall Financial Resource Strain (CARDIA)    Difficulty of Paying Living Expenses: Not hard at all  Food Insecurity: No Food Insecurity (02/22/2023)   Hunger Vital Sign    Worried About Running Out of Food in the Last Year: Never true    Ran Out of Food in the Last Year: Never true  Transportation Needs: No Transportation Needs (02/22/2023)   PRAPARE - Administrator, Civil Service (Medical): No    Lack of Transportation (Non-Medical): No  Physical Activity: Sufficiently Active (02/22/2023)    Exercise Vital Sign    Days of Exercise per Week: 7 days    Minutes of Exercise per Session: 30 min  Stress: No Stress Concern Present (02/22/2023)   [employer] of Occupational Health - Occupational Stress Questionnaire    Feeling of Stress : Not at all  Social Connections: Socially Integrated (02/22/2023)   Social Connection and Isolation Panel [NHANES]    Frequency of Communication with Friends and Family: More than three times a week    Frequency of Social Gatherings with Friends and Family: More than three times a week    Attends Religious Services: More than 4 times per year    Active Member of [employer] or Organizations: Yes    Attends Engineer, structural: More than 4 times per year    Marital Status: Married  Catering manager Violence: Not At Risk (02/22/2023)   Humiliation, Afraid, Rape, and Kick questionnaire    Fear of Current or Ex-Partner: No    Emotionally Abused: No    Physically Abused: No    Sexually Abused: No    Outpatient Medications Prior to Visit  Medication Sig Dispense Refill   irbesartan-hydrochlorothiazide (AVALIDE) 150-12.5 MG tablet Take 2 tablets by mouth daily. 180 tablet 3   No facility-administered medications prior to visit.    Allergies  Allergen Reactions   Hydralazine     Dizziness (normal BP)   Amlodipine Besylate Anxiety    Other Reaction(s): Angioedema   Tarka [Trandolapril-Verapamil Hcl Er] Anxiety    ROS Review of Systems  Constitutional:  Negative for chills and fever.  HENT:  Negative for congestion and sore throat.   Eyes:  Negative for pain and discharge.  Respiratory:  Negative for cough and shortness of breath.   Cardiovascular:  Negative for chest pain and palpitations.  Gastrointestinal:  Negative for constipation, diarrhea, nausea and vomiting.  Endocrine: Negative for polydipsia and polyuria.  Genitourinary:  Negative for dysuria and hematuria.  Musculoskeletal:  Positive for arthralgias. Negative for neck  pain and neck stiffness.  Skin:  Negative for rash.  Neurological:  Negative for dizziness, weakness, numbness and headaches.  Psychiatric/Behavioral:  Negative for agitation and behavioral problems.       Objective:    Physical Exam Vitals reviewed.  Constitutional:      General: He is not in acute distress.    Appearance: He is not diaphoretic.  HENT:     Head: Normocephalic and atraumatic.     Nose: Nose normal.     Mouth/Throat:     Mouth: Mucous membranes are moist.  Eyes:     General: No scleral icterus.    Extraocular Movements: Extraocular movements intact.  Cardiovascular:     Rate and Rhythm: Normal rate and regular rhythm.     Pulses: Normal pulses.     Heart sounds: Normal heart sounds. No  murmur heard. Pulmonary:     Breath sounds: Normal breath sounds. No wheezing or rales.  Musculoskeletal:     Cervical back: Neck supple. No tenderness.     Right lower leg: No edema.     Left lower leg: No edema.     Comments: S/p left TKA, incision site C/D/I  Skin:    General: Skin is warm.     Findings: No rash.  Neurological:     General: No focal deficit present.     Mental Status: He is alert and oriented to person, place, and time.     Sensory: No sensory deficit.     Motor: No weakness.  Psychiatric:        Mood and Affect: Mood normal.        Behavior: Behavior normal.     BP 128/75 (BP Location: Left Arm, Patient Position: Sitting, Cuff Size: Normal)   Pulse 68   Ht 5\' 6"  (1.676 m)   Wt 175 lb 6.4 oz (79.6 kg)   SpO2 92%   BMI 28.31 kg/m  Wt Readings from Last 3 Encounters:  04/10/23 175 lb 6.4 oz (79.6 kg)  03/09/23 169 lb (76.7 kg)  02/22/23 169 lb (76.7 kg)    Lab Results  Component Value Date   TSH 1.510 09/26/2022   Lab Results  Component Value Date   WBC 2.8 (L) 09/26/2022   HGB 13.2 09/26/2022   HCT 37.6 09/26/2022   MCV 87 09/26/2022   PLT 273 09/26/2022   Lab Results  Component Value Date   NA 140 09/26/2022   K 4.9  09/26/2022   CO2 24 09/26/2022   GLUCOSE 96 09/26/2022   BUN 14 09/26/2022   CREATININE 1.00 09/26/2022   BILITOT 0.7 09/26/2022   ALKPHOS 64 09/26/2022   AST 15 09/26/2022   ALT 11 09/26/2022   PROT 6.1 09/26/2022   ALBUMIN 3.8 09/26/2022   CALCIUM 9.1 09/26/2022   ANIONGAP 7 11/12/2019   EGFR 80 09/26/2022   Lab Results  Component Value Date   CHOL 122 09/26/2022   Lab Results  Component Value Date   HDL 45 09/26/2022   Lab Results  Component Value Date   LDLCALC 65 09/26/2022   Lab Results  Component Value Date   TRIG 55 09/26/2022   Lab Results  Component Value Date   CHOLHDL 2.7 09/26/2022   Lab Results  Component Value Date   HGBA1C 5.3 09/26/2022      Assessment & Plan:   Problem List Items Addressed This Visit       Cardiovascular and Mediastinum   MVP (mitral valve prolapse)    Asymptomatic Has had Cardiology evaluation      Essential hypertension - Primary    BP Readings from Last 1 Encounters:  04/10/23 128/75  Well-controlled with Irbesartan-HCTZ 150-12.5 mg 2 tablets QD Had allergic reaction to Amlodipine and other CCBs in the past DC Hydralazine Counseled for compliance with the medications Advised DASH diet and moderate exercise/walking, at least 150 mins/week      Relevant Orders   Basic Metabolic Panel (BMET)     Genitourinary   Persistent microscopic hematuria    Asymptomatic Had persistent microscopic hematuria Referred to urology in [place] for second opinion - usually sees Dr Kenneth Booker, had no RBC in recent UA        Other   History of prostate cancer    S/p total prostatectomy Followed by Dr Kenneth Booker -PSA undetectable in 02/24  Mixed hyperlipidemia    Reports h/o HLD Checked lipid profile Advised to follow DASH diet for now      No orders of the defined types were placed in this encounter.   Follow-up: Return in about 6 months (around 10/11/2023) for Annual physical.    Anabel Halon, MD

## 2023-04-10 NOTE — Patient Instructions (Signed)
Please continue to take medications as prescribed.  Please continue to follow low salt diet and perform moderate exercise/walking at least 150 mins/week. 

## 2023-04-10 NOTE — Assessment & Plan Note (Addendum)
Asymptomatic Had persistent microscopic hematuria Referred to urology in [place] for second opinion - usually sees Dr Laverle Patter, had no RBC in recent UA

## 2023-04-11 LAB — BASIC METABOLIC PANEL
BUN/Creatinine Ratio: 14 (ref 10–24)
BUN: 13 mg/dL (ref 8–27)
CO2: 25 mmol/L (ref 20–29)
Calcium: 9.2 mg/dL (ref 8.6–10.2)
Chloride: 104 mmol/L (ref 96–106)
Creatinine, Ser: 0.96 mg/dL (ref 0.76–1.27)
Glucose: 85 mg/dL (ref 70–99)
Potassium: 4.2 mmol/L (ref 3.5–5.2)
Sodium: 140 mmol/L (ref 134–144)
eGFR: 85 mL/min/{1.73_m2} (ref >59)

## 2023-08-24 ENCOUNTER — Other Ambulatory Visit: Payer: Self-pay | Admitting: Internal Medicine

## 2023-08-24 DIAGNOSIS — I10 Essential (primary) hypertension: Secondary | ICD-10-CM

## 2023-09-05 ENCOUNTER — Ambulatory Visit: Payer: Medicare HMO | Attending: Cardiovascular Disease | Admitting: Cardiovascular Disease

## 2023-09-05 ENCOUNTER — Encounter: Payer: Self-pay | Admitting: Cardiovascular Disease

## 2023-09-05 VITALS — BP 137/86 | HR 56 | Ht 66.0 in | Wt 173.2 lb

## 2023-09-05 DIAGNOSIS — I341 Nonrheumatic mitral (valve) prolapse: Secondary | ICD-10-CM | POA: Diagnosis not present

## 2023-09-05 DIAGNOSIS — I10 Essential (primary) hypertension: Secondary | ICD-10-CM

## 2023-09-05 NOTE — Patient Instructions (Signed)

## 2023-09-05 NOTE — Progress Notes (Signed)
Patient ID: Kenneth Booker, male   DOB: 12-08-1950, 72 y.o.   MRN: [id number]    Cardiology Office Note    Date:  09/05/2023   ID:  Kenneth Booker, DOB [birth year], MRN [id number]  PCP:  Anabel Halon, MD  Cardiologist:   Thurmon Fair, MD   Chief Complaint  Patient presents with   Hypertension     History of Present Illness:  Kenneth Booker is a 72 y.o. male with hypertension and mild mitral valve prolapse and palpitations, prostate cancer status post prostatectomy.  Kenneth Booker is doing quite well.  He denies any problems with shortness of breath, chest pain, palpitations, dizziness, syncope, edema or lower extremity claudication or focal neurological complaints.  He continues to exercise on a almost daily basis.  He is walking 5 days a week for 30 to 45 minutes and also rides his bike for 4 to 8 miles once or twice a week.  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    He underwent successful left total knee replacement about 3 months ago and has made a very rapid recovery.  Following that he has improved his diet.  He has cut back on sweets, hush puppies and sodas.  He is eating a lot of bananas and drinking beet juice.  He has lost a few pounds.  He is exercising again riding his bike and works a lot in his garden.  His blood pressure has improved substantially.  His palpitations have also decreased in frequency.  He denies any angina or dyspnea at rest or with activity and does not have lower extremity edema, claudication or any focal neurological complaints.  He has not had dizziness or syncope.  His typical blood pressure at home is in the 100-120/60-70 range and his heart rate is in the 50-60s.  It has been 4 years since his prostatectomy and he has normal erections and only mild problems with leaks.  His primary care provider check labs and his lipid profile is also quite favorable with an HDL of 45 and LDL of 65, normal triglycerides, normal glucose and hemoglobin A1c.   Creatinine 0.96, potassium 4.2.   Past Medical History:  Diagnosis Date   Cancer [facility])    Cartilage tear 2009   right knee   PONV (postoperative nausea and vomiting)    Systemic hypertension     Past Surgical History:  Procedure Laterality Date   CARDIOVASCULAR STRESS TEST  02/20/2002   Negative adequate Bruce protocal exercise stress test, there is scintigraphic evidence of apical thinning with normal dynamic images EF 60% calculated by QGS   CATARACT EXTRACTION W/PHACO Right 05/16/2013   Procedure: CATARACT EXTRACTION PHACO AND INTRAOCULAR LENS PLACEMENT (IOC);  Surgeon: Gemma Payor, MD;  Location: [facility];  Service: Ophthalmology;  Laterality: Right;  CDE:5.25   CATARACT EXTRACTION W/PHACO Left 06/03/2013   Procedure: CATARACT EXTRACTION PHACO AND INTRAOCULAR LENS PLACEMENT (IOC);  Surgeon: Gemma Payor, MD;  Location: [facility];  Service: Ophthalmology;  Laterality: Left;  CDE: 6.26   JOINT REPLACEMENT Left 09/23/2021   Done by Dr Eulah Pont   KNEE CARTILAGE SURGERY Right 2007?   RENAL DUPLEX  02/20/2003   Bilateral renal arteries demonstrate normal valveswith no hemodynamically significance. Bilateral kidneysare symmetrical in shape with no obvious abnormality visualized.   ROBOT ASSISTED LAPAROSCOPIC RADICAL PROSTATECTOMY N/A 11/11/2019   Procedure: XI ROBOTIC ASSISTED LAPAROSCOPIC RADICAL PROSTATECTOMY LEVEL 1;  Surgeon: Heloise Purpura, MD;  Location: [facility];  Service: Urology;  Laterality: N/A;  TRANSTHORACIC ECHOCARDIOGRAM  03/20/2012   EF >55%, suggestive of borderline impaired LV relaxation with normal tissue doppler, mild MR, mild TR.     Outpatient Medications Prior to Visit  Medication Sig Dispense Refill   irbesartan-hydrochlorothiazide (AVALIDE) 150-12.5 MG tablet TAKE 2 TABLETS BY MOUTH EVERY DAY 180 tablet 3   No facility-administered medications prior to visit.     Allergies:   Hydralazine, Amlodipine besylate, and Tarka [trandolapril-verapamil hcl er]   Social  History   Socioeconomic History   Marital status: Married    Spouse name: Not on file   Number of children: Not on file   Years of education: Not on file   Highest education level: Not on file  Occupational History   Not on file  Tobacco Use   Smoking status: Never   Smokeless tobacco: Never  Vaping Use   Vaping status: Never Used  Substance and Sexual Activity   Alcohol use: No   Drug use: No   Sexual activity: Not Currently  Other Topics Concern   Not on file  Social History Narrative   Not on file   Social Determinants of Health   Financial Resource Strain: Low Risk  (02/22/2023)   Overall Financial Resource Strain (CARDIA)    Difficulty of Paying Living Expenses: Not hard at all  Food Insecurity: No Food Insecurity (02/22/2023)   Hunger Vital Sign    Worried About Running Out of Food in the Last Year: Never true    Ran Out of Food in the Last Year: Never true  Transportation Needs: No Transportation Needs (02/22/2023)   PRAPARE - Administrator, Civil Service (Medical): No    Lack of Transportation (Non-Medical): No  Physical Activity: Sufficiently Active (02/22/2023)   Exercise Vital Sign    Days of Exercise per Week: 7 days    Minutes of Exercise per Session: 30 min  Stress: No Stress Concern Present (02/22/2023)   [employer] of Occupational Health - Occupational Stress Questionnaire    Feeling of Stress : Not at all  Social Connections: Socially Integrated (02/22/2023)   Social Connection and Isolation Panel [NHANES]    Frequency of Communication with Friends and Family: More than three times a week    Frequency of Social Gatherings with Friends and Family: More than three times a week    Attends Religious Services: More than 4 times per year    Active Member of [employer] or Organizations: Yes    Attends Engineer, structural: More than 4 times per year    Marital Status: Married     Family History:  The patient's family history includes  Cancer in his brother, brother, and father; Cancer (age of onset: 50) in his sister; Diabetes in his paternal grandmother; Heart attack in his mother; Hypertension in his sister and sister.   ROS:   Please see the history of present illness.    ROS  All other systems are reviewed and are negative.  PHYSICAL EXAM:   VS:  BP 137/86   Pulse (!) 56   Ht 5\' 6"  (1.676 m)   Wt 173 lb 3.2 oz (78.6 kg)   SpO2 96%   BMI 27.96 kg/m       General: Alert, oriented x3, no distress, appears physically fit and younger than stated age, minimally overweight Head: no evidence of trauma, PERRL, EOMI, no exophtalmos or lid lag, no myxedema, no xanthelasma; normal ears, nose and oropharynx Neck: normal jugular venous pulsations and no hepatojugular  reflux; brisk carotid pulses without delay and no carotid bruits Chest: clear to auscultation, no signs of consolidation by percussion or palpation, normal fremitus, symmetrical and full respiratory excursions Cardiovascular: normal position and quality of the apical impulse, regular rhythm, normal first and second heart sounds, no murmurs, rubs or gallops Abdomen: no tenderness or distention, no masses by palpation, no abnormal pulsatility or arterial bruits, normal bowel sounds, no hepatosplenomegaly Extremities: no clubbing, cyanosis or edema; 2+ radial, ulnar and brachial pulses bilaterally; 2+ right femoral, posterior tibial and dorsalis pedis pulses; 2+ left femoral, posterior tibial and dorsalis pedis pulses; no subclavian or femoral bruits Neurological: grossly nonfocal Psych: Normal mood and affect      Wt Readings from Last 3 Encounters:  09/05/23 173 lb 3.2 oz (78.6 kg)  04/10/23 175 lb 6.4 oz (79.6 kg)  03/09/23 169 lb (76.7 kg)      Studies/Labs Reviewed:   EKG:    EKG Interpretation Date/Time:  Tuesday September 05 2023 16:09:56 EST Ventricular Rate:  56 PR Interval:  158 QRS Duration:  88 QT Interval:  410 QTC Calculation: 395 R  Axis:   -7  Text Interpretation: Sinus bradycardia When compared with ECG of 11-Jun-2013 18:27, Questionable change in QRS axis Confirmed by Uziah Sorter ([id number]) on 09/05/2023 4:25:48 PM         Lipid Panel     Component Value Date/Time   CHOL 122 09/26/2022 1012   TRIG 55 09/26/2022 1012   HDL 45 09/26/2022 1012   CHOLHDL 2.7 09/26/2022 1012   LDLCALC 65 09/26/2022 1012   BMET    Component Value Date/Time   NA 140 04/10/2023 0846   K 4.2 04/10/2023 0846   CL 104 04/10/2023 0846   CO2 25 04/10/2023 0846   GLUCOSE 85 04/10/2023 0846   GLUCOSE 161 (H) 11/12/2019 0421   BUN 13 04/10/2023 0846   CREATININE 0.96 04/10/2023 0846   CALCIUM 9.2 04/10/2023 0846   GFRNONAA 52 (L) 11/12/2019 0421   GFRAA >60 11/12/2019 0421   ASSESSMENT:    1. MVP (mitral valve prolapse)   2. Essential hypertension        PLAN:  In order of problems listed above:  Palpitations: currently asymptomatic HTN: typically excellent control. Has frequent situational hypertension in the cardiology office. MVP: He has a midsystolic click.  He does not have a murmur. HLP: continues to have an excellent lipid profile, improved substantially just with changes in diet.  Medication Adjustments/Labs and Tests Ordered: Current medicines are reviewed at length with the patient today.  Concerns regarding medicines are outlined above.  Medication changes, Labs and Tests ordered today are listed in the Patient Instructions below. Patient Instructions  Medication Instructions:  No changes *If you need a refill on your cardiac medications before your next appointment, please call your pharmacy*  Follow-Up: At [facility], you and your health needs are our priority.  As part of our continuing mission to provide you with exceptional heart care, we have created designated Provider Care Teams.  These Care Teams include your primary Cardiologist (physician) and Advanced Practice Providers (APPs -   Physician Assistants and Nurse Practitioners) who all work together to provide you with the care you need, when you need it.  We recommend signing up for the patient portal called "MyChart".  Sign up information is provided on this After Visit Summary.  MyChart is used to connect with patients for Virtual Visits (Telemedicine).  Patients are able to view lab/test results, encounter  notes, upcoming appointments, etc.  Non-urgent messages can be sent to your provider as well.   To learn more about what you can do with MyChart, go to [email or web address].    Your next appointment:   1 year(s)  Provider:   Thurmon Fair, MD         Signed, Thurmon Fair, MD  09/05/2023 7:56 PM    [facility] [address] [place], [place], [place]  [address] Phone: [phone]; Fax: [phone]

## 2023-09-20 ENCOUNTER — Telehealth: Payer: Self-pay

## 2023-09-20 NOTE — Telephone Encounter (Signed)
Copied from CRM [id number]. Topic: Appointments - Scheduling Inquiry for Clinic >> Sep 20, 2023  2:55 PM Kenneth Booker A wrote: Reason for CRM: Patient wants to know if he has to come in prior to his appointment for blood work. Patient would like to be contacted back for clarification at [phone].

## 2023-09-24 ENCOUNTER — Other Ambulatory Visit: Payer: Self-pay | Admitting: Internal Medicine

## 2023-09-24 DIAGNOSIS — R739 Hyperglycemia, unspecified: Secondary | ICD-10-CM

## 2023-09-24 DIAGNOSIS — E782 Mixed hyperlipidemia: Secondary | ICD-10-CM

## 2023-09-24 DIAGNOSIS — I10 Essential (primary) hypertension: Secondary | ICD-10-CM

## 2023-09-24 DIAGNOSIS — E559 Vitamin D deficiency, unspecified: Secondary | ICD-10-CM

## 2023-09-25 NOTE — Telephone Encounter (Signed)
Patient advised.

## 2023-10-06 LAB — CBC WITH DIFFERENTIAL/PLATELET
Basophils Absolute: 0.1 10*3/uL (ref 0.0–0.2)
Basos: 2 %
EOS (ABSOLUTE): 0.1 10*3/uL (ref 0.0–0.4)
Eos: 4 %
Hematocrit: 39.3 % (ref 37.5–51.0)
Hemoglobin: 13.1 g/dL (ref 13.0–17.7)
Immature Grans (Abs): 0 10*3/uL (ref 0.0–0.1)
Immature Granulocytes: 0 %
Lymphocytes Absolute: 1.2 10*3/uL (ref 0.7–3.1)
Lymphs: 33 %
MCH: 30 pg (ref 26.6–33.0)
MCHC: 33.3 g/dL (ref 31.5–35.7)
MCV: 90 fL (ref 79–97)
Monocytes Absolute: 0.7 10*3/uL (ref 0.1–0.9)
Monocytes: 18 %
Neutrophils Absolute: 1.6 10*3/uL (ref 1.4–7.0)
Neutrophils: 43 %
Platelets: 272 10*3/uL (ref 150–450)
RBC: 4.36 x10E6/uL (ref 4.14–5.80)
RDW: 17.1 % — ABNORMAL HIGH (ref 11.6–15.4)
WBC: 3.7 10*3/uL (ref 3.4–10.8)

## 2023-10-06 LAB — CMP14+EGFR
ALT: 23 [IU]/L (ref 0–44)
AST: 31 [IU]/L (ref 0–40)
Albumin: 4 g/dL (ref 3.8–4.8)
Alkaline Phosphatase: 68 [IU]/L (ref 44–121)
BUN/Creatinine Ratio: 14 (ref 10–24)
BUN: 14 mg/dL (ref 8–27)
Bilirubin Total: 0.5 mg/dL (ref 0.0–1.2)
CO2: 23 mmol/L (ref 20–29)
Calcium: 9.2 mg/dL (ref 8.6–10.2)
Chloride: 105 mmol/L (ref 96–106)
Creatinine, Ser: 1.02 mg/dL (ref 0.76–1.27)
Globulin, Total: 1.9 g/dL (ref 1.5–4.5)
Glucose: 96 mg/dL (ref 70–99)
Potassium: 4.9 mmol/L (ref 3.5–5.2)
Sodium: 142 mmol/L (ref 134–144)
Total Protein: 5.9 g/dL — ABNORMAL LOW (ref 6.0–8.5)
eGFR: 78 mL/min/{1.73_m2} (ref >59)

## 2023-10-06 LAB — VITAMIN D 25 HYDROXY (VIT D DEFICIENCY, FRACTURES): Vit D, 25-Hydroxy: 39 ng/mL (ref 30.0–100.0)

## 2023-10-06 LAB — LIPID PANEL
Chol/HDL Ratio: 2.8 {ratio} (ref 0.0–5.0)
Cholesterol, Total: 115 mg/dL (ref 100–199)
HDL: 41 mg/dL (ref >39)
LDL Chol Calc (NIH): 59 mg/dL (ref 0–99)
Triglycerides: 73 mg/dL (ref 0–149)
VLDL Cholesterol Cal: 15 mg/dL (ref 5–40)

## 2023-10-06 LAB — HEMOGLOBIN A1C
Est. average glucose Bld gHb Est-mCnc: 103 mg/dL
Hgb A1c MFr Bld: 5.2 % (ref 4.8–5.6)

## 2023-10-06 LAB — TSH: TSH: 1.56 u[IU]/mL (ref 0.450–4.500)

## 2023-10-12 ENCOUNTER — Encounter: Payer: Self-pay | Admitting: Internal Medicine

## 2023-10-12 ENCOUNTER — Ambulatory Visit (INDEPENDENT_AMBULATORY_CARE_PROVIDER_SITE_OTHER): Payer: Medicare Other | Admitting: Internal Medicine

## 2023-10-12 VITALS — BP 128/64 | HR 69 | Ht 66.0 in | Wt 180.0 lb

## 2023-10-12 DIAGNOSIS — J329 Chronic sinusitis, unspecified: Secondary | ICD-10-CM

## 2023-10-12 DIAGNOSIS — I10 Essential (primary) hypertension: Secondary | ICD-10-CM | POA: Diagnosis not present

## 2023-10-12 DIAGNOSIS — Z0001 Encounter for general adult medical examination with abnormal findings: Secondary | ICD-10-CM | POA: Diagnosis not present

## 2023-10-12 DIAGNOSIS — B9789 Other viral agents as the cause of diseases classified elsewhere: Secondary | ICD-10-CM | POA: Insufficient documentation

## 2023-10-12 DIAGNOSIS — I341 Nonrheumatic mitral (valve) prolapse: Secondary | ICD-10-CM

## 2023-10-12 NOTE — Assessment & Plan Note (Addendum)
 Asymptomatic Followed by Cardiology

## 2023-10-12 NOTE — Progress Notes (Signed)
 Established Patient Office Visit  Subjective:  Patient ID: Kenneth Booker, male    DOB: 1951/04/20  Age: 73 y.o. MRN: [id number]  CC:  Chief Complaint  Patient presents with   Annual Exam   Sinusitis    Sinus infection     HPI Kenneth Booker is a 73 y.o. male with past medical history of HTN, prostate cancer s/p total prostatectomy and OA of knee who presents for annual physical.  HTN: His BP is well-controlled with Avalide  only.  He has resumed his exercise regimen. He currently denies any headache, dizziness, chest pain, dyspnea or palpitations.  His home BP readings are around 120s/70s.  He reports nasal congestion, sinus pressure related headache and dry cough for the last 2 weeks, but has been getting better now with DayQuil/NyQuil.  Denies any fever, chills, dyspnea or wheezing currently.   Past Medical History:  Diagnosis Date   Cancer [facility])    Cartilage tear 2009   right knee   PONV (postoperative nausea and vomiting)    Systemic hypertension     Past Surgical History:  Procedure Laterality Date   CARDIOVASCULAR STRESS TEST  02/20/2002   Negative adequate Bruce protocal exercise stress test, there is scintigraphic evidence of apical thinning with normal dynamic images EF 60% calculated by QGS   CATARACT EXTRACTION W/PHACO Right 05/16/2013   Procedure: CATARACT EXTRACTION PHACO AND INTRAOCULAR LENS PLACEMENT (IOC);  Surgeon: Cherene Mania, MD;  Location: [facility];  Service: Ophthalmology;  Laterality: Right;  CDE:5.25   CATARACT EXTRACTION W/PHACO Left 06/03/2013   Procedure: CATARACT EXTRACTION PHACO AND INTRAOCULAR LENS PLACEMENT (IOC);  Surgeon: Cherene Mania, MD;  Location: [facility];  Service: Ophthalmology;  Laterality: Left;  CDE: 6.26   JOINT REPLACEMENT Left 09/23/2021   Done by Dr Beverley   KNEE CARTILAGE SURGERY Right 2007?   RENAL DUPLEX  02/20/2003   Bilateral renal arteries demonstrate normal valveswith no hemodynamically significance. Bilateral kidneysare  symmetrical in shape with no obvious abnormality visualized.   ROBOT ASSISTED LAPAROSCOPIC RADICAL PROSTATECTOMY N/A 11/11/2019   Procedure: XI ROBOTIC ASSISTED LAPAROSCOPIC RADICAL PROSTATECTOMY LEVEL 1;  Surgeon: Renda Glance, MD;  Location: [facility];  Service: Urology;  Laterality: N/A;   TRANSTHORACIC ECHOCARDIOGRAM  03/20/2012   EF >55%, suggestive of borderline impaired LV relaxation with normal tissue doppler, mild MR, mild TR.     Family History  Problem Relation Age of Onset   Heart attack Mother    Cancer Father        Lung cancer   Hypertension Sister    Cancer Sister 41       Breast cancer   Cancer Brother        Colon cancer   Diabetes Paternal Grandmother    Hypertension Sister    Cancer Brother        Stomach cancer    Social History   Socioeconomic History   Marital status: Married    Spouse name: Not on file   Number of children: Not on file   Years of education: Not on file   Highest education level: Not on file  Occupational History   Not on file  Tobacco Use   Smoking status: Never   Smokeless tobacco: Never  Vaping Use   Vaping status: Never Used  Substance and Sexual Activity   Alcohol  use: No   Drug use: No   Sexual activity: Not Currently  Other Topics Concern   Not on file  Social History Narrative  Not on file   Social Drivers of Health   Financial Resource Strain: Low Risk  (02/22/2023)   Overall Financial Resource Strain (CARDIA)    Difficulty of Paying Living Expenses: Not hard at all  Food Insecurity: No Food Insecurity (02/22/2023)   Hunger Vital Sign    Worried About Running Out of Food in the Last Year: Never true    Ran Out of Food in the Last Year: Never true  Transportation Needs: No Transportation Needs (02/22/2023)   PRAPARE - Administrator, Civil Service (Medical): No    Lack of Transportation (Non-Medical): No  Physical Activity: Sufficiently Active (02/22/2023)   Exercise Vital Sign    Days of Exercise per  Week: 7 days    Minutes of Exercise per Session: 30 min  Stress: No Stress Concern Present (02/22/2023)   [employer] of Occupational Health - Occupational Stress Questionnaire    Feeling of Stress : Not at all  Social Connections: Socially Integrated (02/22/2023)   Social Connection and Isolation Panel [NHANES]    Frequency of Communication with Friends and Family: More than three times a week    Frequency of Social Gatherings with Friends and Family: More than three times a week    Attends Religious Services: More than 4 times per year    Active Member of [employer] or Organizations: Yes    Attends Engineer, Structural: More than 4 times per year    Marital Status: Married  Catering Manager Violence: Not At Risk (02/22/2023)   Humiliation, Afraid, Rape, and Kick questionnaire    Fear of Current or Ex-Partner: No    Emotionally Abused: No    Physically Abused: No    Sexually Abused: No    Outpatient Medications Prior to Visit  Medication Sig Dispense Refill   irbesartan -hydrochlorothiazide  (AVALIDE ) 150-12.5 MG tablet TAKE 2 TABLETS BY MOUTH EVERY DAY 180 tablet 3   No facility-administered medications prior to visit.    Allergies  Allergen Reactions   Hydralazine      Dizziness (normal BP)   Amlodipine  Besylate Anxiety    Other Reaction(s): Angioedema   Tarka [Trandolapril-Verapamil Hcl Er] Anxiety    ROS Review of Systems  Constitutional:  Negative for chills and fever.  HENT:  Positive for congestion. Negative for sore throat.   Eyes:  Negative for pain and discharge.  Respiratory:  Negative for shortness of breath and wheezing.   Cardiovascular:  Negative for chest pain and palpitations.  Gastrointestinal:  Negative for constipation, diarrhea, nausea and vomiting.  Endocrine: Negative for polydipsia and polyuria.  Genitourinary:  Negative for dysuria and hematuria.  Musculoskeletal:  Positive for arthralgias. Negative for neck pain and neck stiffness.  Skin:   Negative for rash.  Neurological:  Negative for dizziness, weakness, numbness and headaches.  Psychiatric/Behavioral:  Negative for agitation and behavioral problems.       Objective:    Physical Exam Vitals reviewed.  Constitutional:      General: He is not in acute distress.    Appearance: He is not diaphoretic.  HENT:     Head: Normocephalic and atraumatic.     Nose: Congestion present.     Mouth/Throat:     Mouth: Mucous membranes are moist.  Eyes:     General: No scleral icterus.    Extraocular Movements: Extraocular movements intact.  Cardiovascular:     Rate and Rhythm: Normal rate and regular rhythm.     Pulses: Normal pulses.     Heart sounds:  Normal heart sounds. No murmur heard. Pulmonary:     Breath sounds: Normal breath sounds. No wheezing or rales.  Abdominal:     Palpations: Abdomen is soft.     Tenderness: There is no abdominal tenderness.  Musculoskeletal:     Cervical back: Neck supple. No tenderness.     Right lower leg: No edema.     Left lower leg: No edema.     Comments: S/p left TKA, incision site C/D/I  Skin:    General: Skin is warm.     Findings: No rash.  Neurological:     General: No focal deficit present.     Mental Status: He is alert and oriented to person, place, and time.     Cranial Nerves: No cranial nerve deficit.     Sensory: No sensory deficit.     Motor: No weakness.  Psychiatric:        Mood and Affect: Mood normal.        Behavior: Behavior normal.     BP 128/64 (BP Location: Right Arm)   Pulse 69   Ht 5' 6 (1.676 m)   Wt 180 lb (81.6 kg)   SpO2 93%   BMI 29.05 kg/m  Wt Readings from Last 3 Encounters:  10/12/23 180 lb (81.6 kg)  09/05/23 173 lb 3.2 oz (78.6 kg)  04/10/23 175 lb 6.4 oz (79.6 kg)    Lab Results  Component Value Date   TSH 1.560 10/05/2023   Lab Results  Component Value Date   WBC 3.7 10/05/2023   HGB 13.1 10/05/2023   HCT 39.3 10/05/2023   MCV 90 10/05/2023   PLT 272 10/05/2023   Lab  Results  Component Value Date   NA 142 10/05/2023   K 4.9 10/05/2023   CO2 23 10/05/2023   GLUCOSE 96 10/05/2023   BUN 14 10/05/2023   CREATININE 1.02 10/05/2023   BILITOT 0.5 10/05/2023   ALKPHOS 68 10/05/2023   AST 31 10/05/2023   ALT 23 10/05/2023   PROT 5.9 (L) 10/05/2023   ALBUMIN 4.0 10/05/2023   CALCIUM 9.2 10/05/2023   ANIONGAP 7 11/12/2019   EGFR 78 10/05/2023   Lab Results  Component Value Date   CHOL 115 10/05/2023   Lab Results  Component Value Date   HDL 41 10/05/2023   Lab Results  Component Value Date   LDLCALC 59 10/05/2023   Lab Results  Component Value Date   TRIG 73 10/05/2023   Lab Results  Component Value Date   CHOLHDL 2.8 10/05/2023   Lab Results  Component Value Date   HGBA1C 5.2 10/05/2023      Assessment & Plan:   Problem List Items Addressed This Visit       Cardiovascular and Mediastinum   MVP (mitral valve prolapse)   Asymptomatic Followed by Cardiology      Essential hypertension   BP Readings from Last 1 Encounters:  10/12/23 128/64   Well-controlled with Irbesartan -HCTZ 150-12.5 mg 2 tablets QD Had allergic reaction to Amlodipine  and other CCBs in the past Counseled for compliance with the medications Advised DASH diet and moderate exercise/walking, at least 150 mins/week        Respiratory   Viral sinusitis   Symptoms improving with symptomatic treatment Continue DayQuil/NyQuil as needed for nasal congestion Advised to use nasal saline spray as needed for nasal congestion Advised to contact if persistent symptoms        Other   Encounter for general adult medical examination  with abnormal findings - Primary   Physical exam as documented. Counseling done  re healthy lifestyle involving commitment to 150 minutes exercise per week, heart healthy diet, and attaining healthy weight.The importance of adequate sleep also discussed. Immunization and cancer screening needs are specifically addressed at this  visit.        No orders of the defined types were placed in this encounter.   Follow-up: Return in about 6 months (around 04/10/2024) for HTN.    Suzzane MARLA Blanch, MD

## 2023-10-12 NOTE — Assessment & Plan Note (Addendum)
 BP Readings from Last 1 Encounters:  10/12/23 128/64   Well-controlled with Irbesartan -HCTZ 150-12.5 mg 2 tablets QD Had allergic reaction to Amlodipine  and other CCBs in the past Counseled for compliance with the medications Advised DASH diet and moderate exercise/walking, at least 150 mins/week

## 2023-10-12 NOTE — Patient Instructions (Signed)
Please continue to take medications as prescribed.  Please continue to follow low salt diet and perform moderate exercise/walking at least 150 mins/week.  Please consider getting Tdap vaccine at local pharmacy.

## 2023-10-12 NOTE — Assessment & Plan Note (Signed)
 Physical exam as documented. Counseling done  re healthy lifestyle involving commitment to 150 minutes exercise per week, heart healthy diet, and attaining healthy weight.The importance of adequate sleep also discussed. Immunization and cancer screening needs are specifically addressed at this visit.

## 2023-10-12 NOTE — Assessment & Plan Note (Signed)
 Symptoms improving with symptomatic treatment Continue DayQuil/NyQuil as needed for nasal congestion Advised to use nasal saline spray as needed for nasal congestion Advised to contact if persistent symptoms

## 2024-02-28 ENCOUNTER — Ambulatory Visit: Payer: Medicare HMO

## 2024-04-10 ENCOUNTER — Ambulatory Visit: Payer: Medicare Other | Admitting: Internal Medicine

## 2024-05-01 ENCOUNTER — Encounter: Payer: Self-pay | Admitting: Internal Medicine

## 2024-05-01 ENCOUNTER — Ambulatory Visit (INDEPENDENT_AMBULATORY_CARE_PROVIDER_SITE_OTHER): Admitting: Internal Medicine

## 2024-05-01 VITALS — BP 132/80 | HR 60 | Ht 66.0 in | Wt 172.6 lb

## 2024-05-01 DIAGNOSIS — K1321 Leukoplakia of oral mucosa, including tongue: Secondary | ICD-10-CM | POA: Insufficient documentation

## 2024-05-01 DIAGNOSIS — I341 Nonrheumatic mitral (valve) prolapse: Secondary | ICD-10-CM | POA: Diagnosis not present

## 2024-05-01 DIAGNOSIS — E782 Mixed hyperlipidemia: Secondary | ICD-10-CM

## 2024-05-01 DIAGNOSIS — I10 Essential (primary) hypertension: Secondary | ICD-10-CM | POA: Diagnosis not present

## 2024-05-01 DIAGNOSIS — R42 Dizziness and giddiness: Secondary | ICD-10-CM

## 2024-05-01 DIAGNOSIS — R55 Syncope and collapse: Secondary | ICD-10-CM | POA: Insufficient documentation

## 2024-05-01 NOTE — Assessment & Plan Note (Signed)
 BP Readings from Last 1 Encounters:  05/01/24 132/80   Well-controlled with Irbesartan -HCTZ 150-12.5 mg 2 tablets QD Had allergic reaction to Amlodipine  and other CCBs in the past Counseled for compliance with the medications Advised DASH diet and moderate exercise/walking, at least 150 mins/week

## 2024-05-01 NOTE — Assessment & Plan Note (Signed)
Reports h/o HLD Checked lipid profile Advised to follow DASH diet for now 

## 2024-05-01 NOTE — Assessment & Plan Note (Addendum)
 Reports episodes of postural dizziness, especially while riding cycle or working outdoors Although orthostatic vitals have been negative in the office, he likely has dehydration related dizziness Advised to maintain at least 64 ounces of fluid intake in a day Check BMP

## 2024-05-01 NOTE — Progress Notes (Signed)
 Established Patient Office Visit  Subjective:  Patient ID: Kenneth Booker, male    DOB: 1950-12-16  Age: 73 y.o. MRN: [id number]  CC:  Chief Complaint  Patient presents with   Medical Management of Chronic Issues    6 month f/u    Oral Pain    Has a concern on his gum from dentures.     HPI Kenneth Booker is a 73 y.o. male with past medical history of HTN, prostate cancer s/p total prostatectomy and OA of knee who presents for annual physical.  HTN: His BP is well-controlled with Avalide  only.  He has resumed his exercise regimen. He currently denies any headache, dizziness, chest pain, dyspnea or palpitations.  His home BP readings are around 120s/70s.  He reports having whitish patch on the lower gums.  He wears dentures for many years, but has not seen dentist recently.  Denies any oral pain currently.  He has had oral ulcers in the past.   Past Medical History:  Diagnosis Date   Cancer [facility])    Cartilage tear 2009   right knee   PONV (postoperative nausea and vomiting)    Systemic hypertension     Past Surgical History:  Procedure Laterality Date   CARDIOVASCULAR STRESS TEST  02/20/2002   Negative adequate Bruce protocal exercise stress test, there is scintigraphic evidence of apical thinning with normal dynamic images EF 60% calculated by QGS   CATARACT EXTRACTION W/PHACO Right 05/16/2013   Procedure: CATARACT EXTRACTION PHACO AND INTRAOCULAR LENS PLACEMENT (IOC);  Surgeon: Cherene Mania, MD;  Location: [facility];  Service: Ophthalmology;  Laterality: Right;  CDE:5.25   CATARACT EXTRACTION W/PHACO Left 06/03/2013   Procedure: CATARACT EXTRACTION PHACO AND INTRAOCULAR LENS PLACEMENT (IOC);  Surgeon: Cherene Mania, MD;  Location: [facility];  Service: Ophthalmology;  Laterality: Left;  CDE: 6.26   JOINT REPLACEMENT Left 09/23/2021   Done by Dr Beverley   KNEE CARTILAGE SURGERY Right 2007?   RENAL DUPLEX  02/20/2003   Bilateral renal arteries demonstrate normal valveswith no  hemodynamically significance. Bilateral kidneysare symmetrical in shape with no obvious abnormality visualized.   ROBOT ASSISTED LAPAROSCOPIC RADICAL PROSTATECTOMY N/A 11/11/2019   Procedure: XI ROBOTIC ASSISTED LAPAROSCOPIC RADICAL PROSTATECTOMY LEVEL 1;  Surgeon: Renda Glance, MD;  Location: [facility];  Service: Urology;  Laterality: N/A;   TRANSTHORACIC ECHOCARDIOGRAM  03/20/2012   EF >55%, suggestive of borderline impaired LV relaxation with normal tissue doppler, mild MR, mild TR.     Family History  Problem Relation Age of Onset   Heart attack Mother    Cancer Father        Lung cancer   Hypertension Sister    Cancer Sister 1       Breast cancer   Cancer Brother        Colon cancer   Diabetes Paternal Grandmother    Hypertension Sister    Cancer Brother        Stomach cancer    Social History   Socioeconomic History   Marital status: Married    Spouse name: Not on file   Number of children: Not on file   Years of education: Not on file   Highest education level: Not on file  Occupational History   Not on file  Tobacco Use   Smoking status: Never   Smokeless tobacco: Never  Vaping Use   Vaping status: Never Used  Substance and Sexual Activity   Alcohol  use: No   Drug use: No  Sexual activity: Not Currently  Other Topics Concern   Not on file  Social History Narrative   Not on file   Social Drivers of Health   Financial Resource Strain: Low Risk  (02/22/2023)   Overall Financial Resource Strain (CARDIA)    Difficulty of Paying Living Expenses: Not hard at all  Food Insecurity: No Food Insecurity (02/22/2023)   Hunger Vital Sign    Worried About Running Out of Food in the Last Year: Never true    Ran Out of Food in the Last Year: Never true  Transportation Needs: No Transportation Needs (02/22/2023)   PRAPARE - Administrator, Civil Service (Medical): No    Lack of Transportation (Non-Medical): No  Physical Activity: Sufficiently Active  (02/22/2023)   Exercise Vital Sign    Days of Exercise per Week: 7 days    Minutes of Exercise per Session: 30 min  Stress: No Stress Concern Present (02/22/2023)   [employer] of Occupational Health - Occupational Stress Questionnaire    Feeling of Stress : Not at all  Social Connections: Socially Integrated (02/22/2023)   Social Connection and Isolation Panel    Frequency of Communication with Friends and Family: More than three times a week    Frequency of Social Gatherings with Friends and Family: More than three times a week    Attends Religious Services: More than 4 times per year    Active Member of [employer] or Organizations: Yes    Attends Engineer, structural: More than 4 times per year    Marital Status: Married  Catering manager Violence: Not At Risk (02/22/2023)   Humiliation, Afraid, Rape, and Kick questionnaire    Fear of Current or Ex-Partner: No    Emotionally Abused: No    Physically Abused: No    Sexually Abused: No    Outpatient Medications Prior to Visit  Medication Sig Dispense Refill   irbesartan -hydrochlorothiazide  (AVALIDE ) 150-12.5 MG tablet TAKE 2 TABLETS BY MOUTH EVERY DAY 180 tablet 3   No facility-administered medications prior to visit.    Allergies  Allergen Reactions   Hydralazine      Dizziness (normal BP)   Amlodipine  Besylate Anxiety    Other Reaction(s): Angioedema   Tarka [Trandolapril-Verapamil Hcl Er] Anxiety    ROS Review of Systems  Constitutional:  Negative for chills and fever.  HENT:  Negative for congestion and sore throat.   Eyes:  Negative for pain and discharge.  Respiratory:  Negative for shortness of breath and wheezing.   Cardiovascular:  Negative for chest pain and palpitations.  Gastrointestinal:  Negative for constipation, diarrhea, nausea and vomiting.  Endocrine: Negative for polydipsia and polyuria.  Genitourinary:  Negative for dysuria and hematuria.  Musculoskeletal:  Positive for arthralgias. Negative  for neck pain and neck stiffness.  Skin:  Negative for rash.  Neurological:  Negative for weakness, numbness and headaches.  Psychiatric/Behavioral:  Negative for agitation and behavioral problems.       Objective:    Physical Exam Vitals reviewed.  Constitutional:      General: He is not in acute distress.    Appearance: He is not diaphoretic.  HENT:     Head: Normocephalic and atraumatic.     Nose: Congestion present.     Mouth/Throat:     Mouth: Mucous membranes are moist.     Comments: Leukoplakia over lower gum Eyes:     General: No scleral icterus.    Extraocular Movements: Extraocular movements intact.  Cardiovascular:  Rate and Rhythm: Normal rate and regular rhythm.     Heart sounds: Normal heart sounds. No murmur heard. Pulmonary:     Breath sounds: Normal breath sounds. No wheezing or rales.  Abdominal:     Palpations: Abdomen is soft.     Tenderness: There is no abdominal tenderness.  Musculoskeletal:     Cervical back: Neck supple. No tenderness.     Right lower leg: No edema.     Left lower leg: No edema.     Comments: S/p left TKA, incision site C/D/I  Skin:    General: Skin is warm.     Findings: No rash.  Neurological:     General: No focal deficit present.     Mental Status: He is alert and oriented to person, place, and time.     Sensory: No sensory deficit.     Motor: No weakness.  Psychiatric:        Mood and Affect: Mood normal.        Behavior: Behavior normal.     BP 132/80   Pulse 60   Ht 5' 6 (1.676 m)   Wt 172 lb 9.6 oz (78.3 kg)   SpO2 92%   BMI 27.86 kg/m  Wt Readings from Last 3 Encounters:  05/01/24 172 lb 9.6 oz (78.3 kg)  10/12/23 180 lb (81.6 kg)  09/05/23 173 lb 3.2 oz (78.6 kg)    Lab Results  Component Value Date   TSH 1.560 10/05/2023   Lab Results  Component Value Date   WBC 3.7 10/05/2023   HGB 13.1 10/05/2023   HCT 39.3 10/05/2023   MCV 90 10/05/2023   PLT 272 10/05/2023   Lab Results  Component  Value Date   NA 142 10/05/2023   K 4.9 10/05/2023   CO2 23 10/05/2023   GLUCOSE 96 10/05/2023   BUN 14 10/05/2023   CREATININE 1.02 10/05/2023   BILITOT 0.5 10/05/2023   ALKPHOS 68 10/05/2023   AST 31 10/05/2023   ALT 23 10/05/2023   PROT 5.9 (L) 10/05/2023   ALBUMIN 4.0 10/05/2023   CALCIUM 9.2 10/05/2023   ANIONGAP 7 11/12/2019   EGFR 78 10/05/2023   Lab Results  Component Value Date   CHOL 115 10/05/2023   Lab Results  Component Value Date   HDL 41 10/05/2023   Lab Results  Component Value Date   LDLCALC 59 10/05/2023   Lab Results  Component Value Date   TRIG 73 10/05/2023   Lab Results  Component Value Date   CHOLHDL 2.8 10/05/2023   Lab Results  Component Value Date   HGBA1C 5.2 10/05/2023      Assessment & Plan:   Problem List Items Addressed This Visit       Cardiovascular and Mediastinum   MVP (mitral valve prolapse)   Asymptomatic Followed by Cardiology      Essential hypertension - Primary   BP Readings from Last 1 Encounters:  05/01/24 132/80   Well-controlled with Irbesartan -HCTZ 150-12.5 mg 2 tablets QD Had allergic reaction to Amlodipine  and other CCBs in the past Counseled for compliance with the medications Advised DASH diet and moderate exercise/walking, at least 150 mins/week      Relevant Orders   Basic Metabolic Panel (BMET)   Postural dizziness with presyncope   Reports episodes of postural dizziness, especially while riding cycle or working outdoors Although orthostatic vitals have been negative in the office, he likely has dehydration related dizziness Advised to maintain at least 64 ounces  of fluid intake in a day Check BMP        Digestive   Leukoplakia of oral cavity   Whitish patch over lower gum likely leukoplakia as it is painless Advised to contact dentist for further evaluation        Other   Mixed hyperlipidemia   Reports h/o HLD Checked lipid profile Advised to follow DASH diet for now          No orders of the defined types were placed in this encounter.   Follow-up: Return in about 6 months (around 11/01/2024) for Annual physical.    Suzzane MARLA Blanch, MD

## 2024-05-01 NOTE — Patient Instructions (Addendum)
Please continue to take medications as prescribed.  Please continue to follow low salt diet and perform moderate exercise/walking at least 150 mins/week. 

## 2024-05-01 NOTE — Assessment & Plan Note (Signed)
 Asymptomatic Followed by Cardiology

## 2024-05-01 NOTE — Assessment & Plan Note (Signed)
 Whitish patch over lower gum likely leukoplakia as it is painless Advised to contact dentist for further evaluation

## 2024-05-02 ENCOUNTER — Ambulatory Visit: Payer: Self-pay | Admitting: Internal Medicine

## 2024-05-02 LAB — BASIC METABOLIC PANEL WITH GFR
BUN/Creatinine Ratio: 13 (ref 10–24)
BUN: 13 mg/dL (ref 8–27)
CO2: 22 mmol/L (ref 20–29)
Calcium: 9.3 mg/dL (ref 8.6–10.2)
Chloride: 103 mmol/L (ref 96–106)
Creatinine, Ser: 1 mg/dL (ref 0.76–1.27)
Glucose: 83 mg/dL (ref 70–99)
Potassium: 4.1 mmol/L (ref 3.5–5.2)
Sodium: 140 mmol/L (ref 134–144)
eGFR: 80 mL/min/1.73 (ref >59)

## 2024-08-05 ENCOUNTER — Other Ambulatory Visit: Payer: Self-pay | Admitting: Internal Medicine

## 2024-08-05 DIAGNOSIS — I10 Essential (primary) hypertension: Secondary | ICD-10-CM

## 2024-09-24 ENCOUNTER — Telehealth: Payer: Self-pay

## 2024-09-24 ENCOUNTER — Other Ambulatory Visit: Payer: Self-pay | Admitting: Internal Medicine

## 2024-09-24 DIAGNOSIS — Z8546 Personal history of malignant neoplasm of prostate: Secondary | ICD-10-CM

## 2024-09-24 DIAGNOSIS — I341 Nonrheumatic mitral (valve) prolapse: Secondary | ICD-10-CM

## 2024-09-24 DIAGNOSIS — E782 Mixed hyperlipidemia: Secondary | ICD-10-CM

## 2024-09-24 DIAGNOSIS — I10 Essential (primary) hypertension: Secondary | ICD-10-CM

## 2024-09-24 DIAGNOSIS — R739 Hyperglycemia, unspecified: Secondary | ICD-10-CM

## 2024-09-24 DIAGNOSIS — R002 Palpitations: Secondary | ICD-10-CM

## 2024-09-24 DIAGNOSIS — E559 Vitamin D deficiency, unspecified: Secondary | ICD-10-CM

## 2024-09-24 NOTE — Telephone Encounter (Signed)
 Patient advised.

## 2024-09-24 NOTE — Telephone Encounter (Signed)
 Copied from CRM #[id number]. Topic: Clinical - Request for Lab/Test Order >> Sep 24, 2024 11:10 AM Rosaria BRAVO wrote: Reason for CRM: Pt wants to have his lab orders placed for his physical

## 2024-11-04 ENCOUNTER — Encounter: Admitting: Internal Medicine

## 2024-11-11 ENCOUNTER — Ambulatory Visit: Admitting: Cardiovascular Disease

## 2024-11-11 ENCOUNTER — Encounter: Payer: Self-pay | Admitting: Cardiovascular Disease

## 2024-11-11 VITALS — BP 128/80 | HR 56 | Ht 66.5 in | Wt 180.0 lb

## 2024-11-11 DIAGNOSIS — E782 Mixed hyperlipidemia: Secondary | ICD-10-CM

## 2024-11-11 DIAGNOSIS — I10 Essential (primary) hypertension: Secondary | ICD-10-CM | POA: Diagnosis not present

## 2024-11-11 DIAGNOSIS — R002 Palpitations: Secondary | ICD-10-CM

## 2024-11-11 DIAGNOSIS — I341 Nonrheumatic mitral (valve) prolapse: Secondary | ICD-10-CM

## 2024-11-11 NOTE — Patient Instructions (Signed)
 Medication Instructions:  NO CHANGES *If you need a refill on your cardiac medications before your next appointment, please call your pharmacy*  Lab Work: NO LABS If you have labs (blood work) drawn today and your tests are completely normal, you will receive your results only by: MyChart Message (if you have MyChart) OR A paper copy in the mail If you have any lab test that is abnormal or we need to change your treatment, we will call you to review the results.  Testing/Procedures: NO TESTING  Follow-Up: At [facility], you and your health needs are our priority.  As part of our continuing mission to provide you with exceptional heart care, our providers are all part of one team.  This team includes your primary Cardiologist (physician) and Advanced Practice Providers or APPs (Physician Assistants and Nurse Practitioners) who all work together to provide you with the care you need, when you need it.  Your next appointment:   1 year(s)  Provider:   Jerel Balding, MD   We recommend signing up for the patient portal called MyChart.  Sign up information is provided on this After Visit Summary.  MyChart is used to connect with patients for Virtual Visits (Telemedicine).  Patients are able to view lab/test results, encounter notes, upcoming appointments, etc.  Non-urgent messages can be sent to your provider as well.   To learn more about what you can do with MyChart, go to [email or web address].

## 2024-11-12 ENCOUNTER — Ambulatory Visit: Admitting: Emergency Medicine

## 2024-11-27 ENCOUNTER — Encounter: Payer: Self-pay | Admitting: Internal Medicine
